# Patient Record
Sex: Male | Born: 1985 | Race: White | Hispanic: No | Marital: Married | Smoking: Former smoker
Health system: Southern US, Community
[De-identification: ages and names within clinical notes are randomized; demographics above are authoritative.]

## PROBLEM LIST (undated history)

## (undated) DIAGNOSIS — K219 Gastro-esophageal reflux disease without esophagitis: Secondary | ICD-10-CM

## (undated) DIAGNOSIS — F411 Generalized anxiety disorder: Secondary | ICD-10-CM

## (undated) DIAGNOSIS — J309 Allergic rhinitis, unspecified: Secondary | ICD-10-CM

## (undated) DIAGNOSIS — I8290 Acute embolism and thrombosis of unspecified vein: Secondary | ICD-10-CM

## (undated) DIAGNOSIS — F329 Major depressive disorder, single episode, unspecified: Secondary | ICD-10-CM

## (undated) DIAGNOSIS — G43009 Migraine without aura, not intractable, without status migrainosus: Secondary | ICD-10-CM

## (undated) DIAGNOSIS — M519 Unspecified thoracic, thoracolumbar and lumbosacral intervertebral disc disorder: Secondary | ICD-10-CM

## (undated) HISTORY — DX: Gastro-esophageal reflux disease without esophagitis: K21.9

## (undated) HISTORY — DX: Unspecified thoracic, thoracolumbar and lumbosacral intervertebral disc disorder: M51.9

## (undated) HISTORY — DX: Allergic rhinitis, unspecified: J30.9

## (undated) HISTORY — DX: Generalized anxiety disorder: F41.1

## (undated) HISTORY — PX: SHOULDER SURGERY: SHX246

## (undated) HISTORY — PX: BACK SURGERY: SHX140

## (undated) HISTORY — DX: Major depressive disorder, single episode, unspecified: F32.9

## (undated) HISTORY — PX: OTHER SURGICAL HISTORY: SHX169

## (undated) HISTORY — DX: Migraine without aura, not intractable, without status migrainosus: G43.009

## (undated) HISTORY — PX: HAND SURGERY: SHX662

---
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
## 2007-09-30 ENCOUNTER — Ambulatory Visit (HOSPITAL_BASED_OUTPATIENT_CLINIC_OR_DEPARTMENT_OTHER): Admission: RE | Admit: 2007-09-30 | Discharge: 2007-09-30 | Payer: Self-pay | Admitting: Orthopedic Surgery

## 2007-10-14 ENCOUNTER — Ambulatory Visit: Payer: Self-pay | Admitting: Internal Medicine

## 2007-10-14 DIAGNOSIS — Z87898 Personal history of other specified conditions: Secondary | ICD-10-CM

## 2007-10-14 DIAGNOSIS — F411 Generalized anxiety disorder: Secondary | ICD-10-CM

## 2007-10-14 DIAGNOSIS — R51 Headache: Secondary | ICD-10-CM

## 2007-10-14 DIAGNOSIS — K219 Gastro-esophageal reflux disease without esophagitis: Secondary | ICD-10-CM | POA: Insufficient documentation

## 2007-10-14 DIAGNOSIS — R519 Headache, unspecified: Secondary | ICD-10-CM | POA: Insufficient documentation

## 2007-10-14 DIAGNOSIS — G43009 Migraine without aura, not intractable, without status migrainosus: Secondary | ICD-10-CM

## 2007-10-14 DIAGNOSIS — F329 Major depressive disorder, single episode, unspecified: Secondary | ICD-10-CM

## 2007-10-14 HISTORY — DX: Major depressive disorder, single episode, unspecified: F32.9

## 2007-10-14 HISTORY — DX: Generalized anxiety disorder: F41.1

## 2007-10-14 HISTORY — DX: Migraine without aura, not intractable, without status migrainosus: G43.009

## 2007-10-14 HISTORY — DX: Gastro-esophageal reflux disease without esophagitis: K21.9

## 2007-10-14 LAB — CONVERTED CEMR LAB
ALT: 22 units/L (ref 0–53)
AST: 17 units/L (ref 0–37)
Albumin: 4.3 g/dL (ref 3.5–5.2)
Alkaline Phosphatase: 56 units/L (ref 39–117)
BUN: 14 mg/dL (ref 6–23)
Basophils Absolute: 0 10*3/uL (ref 0.0–0.1)
Basophils Relative: 0.1 % (ref 0.0–1.0)
Bilirubin Urine: NEGATIVE
Bilirubin, Direct: 0.2 mg/dL (ref 0.0–0.3)
CO2: 32 meq/L (ref 19–32)
Calcium: 9.7 mg/dL (ref 8.4–10.5)
Chloride: 105 meq/L (ref 96–112)
Cholesterol: 148 mg/dL (ref 0–200)
Creatinine, Ser: 1.1 mg/dL (ref 0.4–1.5)
Eosinophils Absolute: 0.1 10*3/uL (ref 0.0–0.6)
Eosinophils Relative: 1.3 % (ref 0.0–5.0)
GFR calc Af Amer: 107 mL/min
GFR calc non Af Amer: 88 mL/min
Glucose, Bld: 83 mg/dL (ref 70–99)
HCT: 43.6 % (ref 39.0–52.0)
HDL: 53.8 mg/dL (ref >39.0)
Hemoglobin, Urine: NEGATIVE
Hemoglobin: 15.2 g/dL (ref 13.0–17.0)
Ketones, ur: NEGATIVE mg/dL
LDL Cholesterol: 80 mg/dL (ref 0–99)
Leukocytes, UA: NEGATIVE
Lymphocytes Relative: 18 % (ref 12.0–46.0)
MCHC: 34.7 g/dL (ref 30.0–36.0)
MCV: 88.6 fL (ref 78.0–100.0)
Monocytes Absolute: 0.5 10*3/uL (ref 0.2–0.7)
Monocytes Relative: 8.3 % (ref 3.0–11.0)
Neutro Abs: 4.3 10*3/uL (ref 1.4–7.7)
Neutrophils Relative %: 72.3 % (ref 43.0–77.0)
Nitrite: NEGATIVE
Platelets: 190 10*3/uL (ref 150–400)
Potassium: 4 meq/L (ref 3.5–5.1)
RBC: 4.93 M/uL (ref 4.22–5.81)
RDW: 11.7 % (ref 11.5–14.6)
Sodium: 142 meq/L (ref 135–145)
Specific Gravity, Urine: 1.025 (ref 1.000–1.03)
TSH: 1.97 microintl units/mL (ref 0.35–5.50)
Total Bilirubin: 1.2 mg/dL (ref 0.3–1.2)
Total CHOL/HDL Ratio: 2.8
Total Protein, Urine: NEGATIVE mg/dL
Total Protein: 7.2 g/dL (ref 6.0–8.3)
Triglycerides: 73 mg/dL (ref 0–149)
Urine Glucose: NEGATIVE mg/dL
Urobilinogen, UA: 0.2 (ref 0.0–1.0)
VLDL: 15 mg/dL (ref 0–40)
WBC: 6 10*3/uL (ref 4.5–10.5)
pH: 6 (ref 5.0–8.0)

## 2007-12-22 ENCOUNTER — Encounter: Payer: Self-pay | Admitting: Internal Medicine

## 2008-10-25 ENCOUNTER — Ambulatory Visit: Payer: Self-pay | Admitting: Internal Medicine

## 2008-10-25 DIAGNOSIS — J019 Acute sinusitis, unspecified: Secondary | ICD-10-CM

## 2008-10-26 LAB — CONVERTED CEMR LAB
ALT: 19 units/L (ref 0–53)
AST: 20 units/L (ref 0–37)
Albumin: 4 g/dL (ref 3.5–5.2)
Alkaline Phosphatase: 52 units/L (ref 39–117)
BUN: 12 mg/dL (ref 6–23)
Basophils Absolute: 0 10*3/uL (ref 0.0–0.1)
Basophils Relative: 0 % (ref 0.0–3.0)
Bilirubin Urine: NEGATIVE
Bilirubin, Direct: 0.1 mg/dL (ref 0.0–0.3)
CO2: 31 meq/L (ref 19–32)
Calcium: 9.3 mg/dL (ref 8.4–10.5)
Chloride: 106 meq/L (ref 96–112)
Cholesterol: 125 mg/dL (ref 0–200)
Creatinine, Ser: 0.9 mg/dL (ref 0.4–1.5)
Eosinophils Absolute: 0.2 10*3/uL (ref 0.0–0.7)
Eosinophils Relative: 1.9 % (ref 0.0–5.0)
GFR calc Af Amer: 133 mL/min
GFR calc non Af Amer: 110 mL/min
Glucose, Bld: 97 mg/dL (ref 70–99)
HCT: 40.9 % (ref 39.0–52.0)
HDL: 38.5 mg/dL — ABNORMAL LOW (ref >39.0)
Hemoglobin: 14.3 g/dL (ref 13.0–17.0)
Ketones, ur: NEGATIVE mg/dL
LDL Cholesterol: 65 mg/dL (ref 0–99)
Leukocytes, UA: NEGATIVE
Lymphocytes Relative: 18.6 % (ref 12.0–46.0)
MCHC: 35 g/dL (ref 30.0–36.0)
MCV: 89.8 fL (ref 78.0–100.0)
Monocytes Absolute: 0.6 10*3/uL (ref 0.1–1.0)
Monocytes Relative: 7.2 % (ref 3.0–12.0)
Neutro Abs: 5.7 10*3/uL (ref 1.4–7.7)
Neutrophils Relative %: 72.3 % (ref 43.0–77.0)
Nitrite: NEGATIVE
Platelets: 200 10*3/uL (ref 150–400)
Potassium: 4 meq/L (ref 3.5–5.1)
RBC: 4.56 M/uL (ref 4.22–5.81)
RDW: 11.6 % (ref 11.5–14.6)
Sodium: 141 meq/L (ref 135–145)
Specific Gravity, Urine: 1.02 (ref 1.000–1.03)
TSH: 2.27 microintl units/mL (ref 0.35–5.50)
Total Bilirubin: 0.9 mg/dL (ref 0.3–1.2)
Total CHOL/HDL Ratio: 3.2
Total Protein, Urine: NEGATIVE mg/dL
Total Protein: 7.1 g/dL (ref 6.0–8.3)
Triglycerides: 106 mg/dL (ref 0–149)
Urine Glucose: NEGATIVE mg/dL
Urobilinogen, UA: 2 (ref 0.0–1.0)
VLDL: 21 mg/dL (ref 0–40)
WBC: 8 10*3/uL (ref 4.5–10.5)
pH: 7 (ref 5.0–8.0)

## 2009-12-11 ENCOUNTER — Ambulatory Visit: Payer: Self-pay | Admitting: Internal Medicine

## 2009-12-11 LAB — CONVERTED CEMR LAB
ALT: 21 units/L (ref 0–53)
AST: 21 units/L (ref 0–37)
Albumin: 4.3 g/dL (ref 3.5–5.2)
Alkaline Phosphatase: 57 units/L (ref 39–117)
BUN: 11 mg/dL (ref 6–23)
Basophils Absolute: 0 10*3/uL (ref 0.0–0.1)
Basophils Relative: 0.8 % (ref 0.0–3.0)
Bilirubin Urine: NEGATIVE
Bilirubin, Direct: 0.2 mg/dL (ref 0.0–0.3)
CO2: 31 meq/L (ref 19–32)
Calcium: 9.2 mg/dL (ref 8.4–10.5)
Chloride: 108 meq/L (ref 96–112)
Cholesterol: 125 mg/dL (ref 0–200)
Creatinine, Ser: 1 mg/dL (ref 0.4–1.5)
Eosinophils Absolute: 0.2 10*3/uL (ref 0.0–0.7)
Eosinophils Relative: 3.6 % (ref 0.0–5.0)
GFR calc non Af Amer: 96.39 mL/min (ref >60)
Glucose, Bld: 98 mg/dL (ref 70–99)
HCT: 41.4 % (ref 39.0–52.0)
HDL: 62.6 mg/dL (ref >39.00)
Hemoglobin, Urine: NEGATIVE
Hemoglobin: 13.8 g/dL (ref 13.0–17.0)
Ketones, ur: NEGATIVE mg/dL
LDL Cholesterol: 44 mg/dL (ref 0–99)
Leukocytes, UA: NEGATIVE
Lymphocytes Relative: 22.2 % (ref 12.0–46.0)
Lymphs Abs: 1.4 10*3/uL (ref 0.7–4.0)
MCHC: 33.5 g/dL (ref 30.0–36.0)
MCV: 91.4 fL (ref 78.0–100.0)
Monocytes Absolute: 0.6 10*3/uL (ref 0.1–1.0)
Monocytes Relative: 9.7 % (ref 3.0–12.0)
Neutro Abs: 4 10*3/uL (ref 1.4–7.7)
Neutrophils Relative %: 63.7 % (ref 43.0–77.0)
Nitrite: NEGATIVE
Platelets: 197 10*3/uL (ref 150.0–400.0)
Potassium: 4.3 meq/L (ref 3.5–5.1)
RBC: 4.52 M/uL (ref 4.22–5.81)
RDW: 11.9 % (ref 11.5–14.6)
Sodium: 143 meq/L (ref 135–145)
Specific Gravity, Urine: >=1.03 (ref 1.000–1.030)
TSH: 2.46 microintl units/mL (ref 0.35–5.50)
Total Bilirubin: 0.7 mg/dL (ref 0.3–1.2)
Total CHOL/HDL Ratio: 2
Total Protein, Urine: NEGATIVE mg/dL
Total Protein: 7.5 g/dL (ref 6.0–8.3)
Triglycerides: 90 mg/dL (ref 0.0–149.0)
Urine Glucose: NEGATIVE mg/dL
Urobilinogen, UA: 1 (ref 0.0–1.0)
VLDL: 18 mg/dL (ref 0.0–40.0)
WBC: 6.2 10*3/uL (ref 4.5–10.5)
pH: 6.5 (ref 5.0–8.0)

## 2009-12-13 ENCOUNTER — Ambulatory Visit: Payer: Self-pay | Admitting: Internal Medicine

## 2010-11-06 NOTE — Assessment & Plan Note (Signed)
Summary: CPX/ NWS #   Vital Signs:  Patient profile:   24 year old male Height:      70 inches Weight:      170.50 pounds BMI:     24.55 O2 Sat:      98 % on Room air Temp:     97.7 degrees F oral Pulse rate:   58 / minute BP sitting:   112 / 70  (left arm) Cuff size:   regular  Vitals Entered ByZella Ball Ewing (December 13, 2009 1:26 PM)  O2 Flow:  Room air  Preventive Care Screening     had flu shot last fall 2010 - free at work  CC: Adult physical/RE   CC:  Adult physical/RE.  History of Present Illness: overall doing well, no specific complaints, except for ongoing stress, mild worsening depressive symptoms without suicidal ideation or panic, and ongoing recurrent migrainous headaches that respond to imitrex but happen multiple times per wk.  Pt denies CP, sob, doe, wheezing, orthopnea, pnd, worsening LE edema, palps, dizziness or syncope  Pt denies new neuro symptoms such as other headache, facial or extremity weakness   Problems Prior to Update: 1)  Sinusitis- Acute-nos  (ICD-461.9) 2)  Preventive Health Care  (ICD-V70.0) 3)  Gerd  (ICD-530.81) 4)  Preventive Health Care  (ICD-V70.0) 5)  Anxiety  (ICD-300.00) 6)  Headache  (ICD-784.0) 7)  Depression  (ICD-311) 8)  Common Migraine  (ICD-346.10) 9)  Migraines, Hx of  (ICD-V13.8)  Medications Prior to Update: 1)  Imitrex 100 Mg  Tabs (Sumatriptan Succinate) .Marland Kitchen.. 1 By Mouth Once Daily As Needed 2)  Fluoxetine Hcl 20 Mg Caps (Fluoxetine Hcl) .Marland Kitchen.. 1po Once Daily 3)  Omeprazole 20 Mg  Cpdr (Omeprazole) .Marland Kitchen.. 1 By Mouth Once Daily 4)  Cephalexin 500 Mg Caps (Cephalexin) .Marland Kitchen.. 1 By Mouth Three Times A Day  Current Medications (verified): 1)  Sumatriptan Succinate 100 Mg Tabs (Sumatriptan Succinate) .Marland Kitchen.. 1 By Mouth Every Other Day As Needed 2)  Fluoxetine Hcl 40 Mg Caps (Fluoxetine Hcl) .Marland Kitchen.. 1 By Mouth Once Daily 3)  Omeprazole 20 Mg  Cpdr (Omeprazole) .Marland Kitchen.. 1 By Mouth Once Daily  Allergies (verified): 1)  ! Ceclor  Past  History:  Past Medical History: Last updated: 10/14/2007 migraine Depression Anxiety  Past Surgical History: Last updated: 10/14/2007 right hand and right foot after accident at work  Family History: Last updated: 10/14/2007 father with depression grandfather with depression grandmother with MI grandfather with brain tumor mother with HTN mother and grandmother with migraine  Social History: Last updated: 10/25/2008 Never Smoked Alcohol use-yes Married 1 child work - loading trucks for food [employer]  Risk Factors: Smoking Status: never (10/14/2007)  Review of Systems  The patient denies anorexia, fever, weight loss, weight gain, vision loss, decreased hearing, hoarseness, chest pain, syncope, dyspnea on exertion, peripheral edema, prolonged cough, hemoptysis, abdominal pain, melena, hematochezia, severe indigestion/heartburn, hematuria, muscle weakness, suspicious skin lesions, transient blindness, difficulty walking, depression, unusual weight change, abnormal bleeding, enlarged lymph nodes, and angioedema.         all otherwise negative per pt -   Physical Exam  General:  alert and well-developed.   Head:  normocephalic and atraumatic.   Eyes:  vision grossly intact, pupils equal, and pupils round.   Ears:  R ear normal and L ear normal.   Nose:  no external deformity and no nasal discharge.   Mouth:  no gingival abnormalities and pharynx pink and moist.   Neck:  supple and no masses.   Lungs:  normal respiratory effort and normal breath sounds.   Heart:  normal rate and regular rhythm.   Abdomen:  soft, non-tender, and normal bowel sounds.   Msk:  no joint tenderness and no joint swelling.   Extremities:  no edema, no erythema  Neurologic:  cranial nerves II-XII intact and strength normal in all extremities.     Impression & Recommendations:  Problem # 1:  Preventive Health Care (ICD-V70.0) Overall doing well, age appropriate education and counseling  updated and referral for appropriate preventive services done unless declined, immunizations up to date or declined, diet counseling done if overweight, urged to quit smoking if smokes , most recent labs reviewed and current ordered if appropriate, ecg reviewed or declined (interpretation per ECG scanned in the EMR if done); information regarding Medicare Prevention requirements given if appropriate   Problem # 2:  COMMON MIGRAINE (ICD-346.10)  His updated medication list for this problem includes:    Sumatriptan Succinate 100 Mg Tabs (Sumatriptan succinate) .Marland Kitchen... 1 by mouth every other day as needed wiith persistent stable but relatively high freq headaches - refer [facility]  Orders: Headache Clinic Referral (Headache)  Problem # 3:  DEPRESSION (ICD-311)  His updated medication list for this problem includes:    Fluoxetine Hcl 40 Mg Caps (Fluoxetine hcl) .Marland Kitchen... 1 by mouth once daily med increased as above   Complete Medication List: 1)  Sumatriptan Succinate 100 Mg Tabs (Sumatriptan succinate) .Marland Kitchen.. 1 by mouth every other day as needed 2)  Fluoxetine Hcl 40 Mg Caps (Fluoxetine hcl) .Marland Kitchen.. 1 by mouth once daily 3)  Omeprazole 20 Mg Cpdr (Omeprazole) .Marland Kitchen.. 1 by mouth once daily  Patient Instructions: 1)  increase the fluoxetine to 40 mg per day 2)  Continue all previous medications as before this visit 3)  You will be contacted about the referral(s) to: [facility] 4)  Please schedule a follow-up appointment in 1 year or sooner if needed Prescriptions: OMEPRAZOLE 20 MG  CPDR (OMEPRAZOLE) 1 by mouth once daily  #90 x 3   Entered and Authorized by:   Corwin Levins MD   Signed by:   Corwin Levins MD on 12/13/2009   Method used:   Electronically to        [facility] #[id number]* (retail)       [address] [place], [place]  [address]       Ph: [phone]       Fax: [phone]   RxID:   [id number] SUMATRIPTAN SUCCINATE 100 MG TABS (SUMATRIPTAN  SUCCINATE) 1 by mouth every other day as needed  #27 x 3   Entered and Authorized by:   Corwin Levins MD   Signed by:   Corwin Levins MD on 12/13/2009   Method used:   Electronically to        [id number]* (retail)       [address] [place], [place]  [address]       Ph: [phone]       Fax: [phone]   RxID:   [id number] FLUOXETINE HCL 40 MG CAPS (FLUOXETINE HCL) 1 by mouth once daily  #90 x 3   Entered and Authorized by:   Corwin Levins MD   Signed by:   Corwin Levins MD on 12/13/2009   Method used:   Electronically to  [id number]* (retail)       [address] [place], [place]  [address]       Ph: [phone]       Fax: [phone]   RxID:   [id number]

## 2011-02-19 NOTE — Op Note (Signed)
NAME:  Ernest Mathews, Ernest Mathews NO.:  [id number]   MEDICAL RECORD NO.:  [id number]          PATIENT TYPE:  AMB   LOCATION:  [facility]                          FACILITY:  [facility]   PHYSICIAN:  Cindee Salt, M.D.       DATE OF BIRTH:  [birth year]   DATE OF PROCEDURE:  09/30/2007  DATE OF DISCHARGE:                               OPERATIVE REPORT   ANESTHESIOLOGIST:  Zenon Mayo, MD   PREOPERATIVE DIAGNOSIS:  Carpal tunnel syndrome, right hand.   POSTOPERATIVE DIAGNOSIS:  Carpal tunnel syndrome, right hand.   OPERATION:  Decompression of right median nerve.   ANESTHESIA:  General.   HISTORY:  The patient is a 25 year old male who suffered a crush injury  to his right hand.  He has had numbness and tingling since the injury.  This has not responded to elevation, conservative treatment, and Medrol  Dosepak.  He is admitted now for decompression of the median nerve,  right hand.  The pre-, peri-, and postoperative course were discussed  along with the risks and complications.  He is aware that there is no  guarantee with the surgery and of the possibility of infection,  recurrence, injury to arteries, nerves, tendons, complete relief of  symptoms, and dystrophy.  In the preoperative area the patient is seen.  Questions again are encouraged and answered.  The extremity is marked by  both the patient and the surgeon.  The antibiotic is given.   PROCEDURE IN DETAIL:  The patient is brought to the operating room where  a general anesthetic was carried out using LMA by the anesthesiologist,  Dr. Sampson Goon.  He was prepped using DuraPrep in the supine position  with the right arm free.  A longitudinal incision was made in the palm  and carried down through subcutaneous tissue.  Bleeders were  electrocauterized.  The palmar fascia was split.  The superficial palmar  arch was identified.  The flexor tendon of the ring and little finger  were identified to the ulnar side of median  nerve.  The carpal  retinaculum was incised with sharp dissection.   A right-angle and Sewall retractor were placed between the skin and  forearm fascia.  Significant hematoma was noted proximally.  The wound  was then extended to the ulnar side of the wrist crease and then onto  the distal forearm and carried down through the subcutaneous tissue.  Bleeders again were electrocauterized.  The median nerve was identified.  Significant hematoma was present along the flexor sheath of the tendons.  More proximally the tendons appeared intact with full flexion/extension  of the fingers.  An exploration of the nerve was performed.   An area of compression was immediately apparent with a significant  hyperemia of the nerve over approximately a 3-cm distance.  The canal  was explored.  No further lesions were identified.  The AP, lateral, and  oblique x-rays of his wrist revealed no acute fractures of the distal  radius scaphoid, carpal bones, or metacarpals.  The wound was  irrigated.  The skin was then closed with interrupted  5-0 Vicryl Rapide  sutures.  Sterile compressive dressing and splint were applied.  The  patient tolerated the procedure well and was taken to the recovery room  for observation in satisfactory condition.  He will be discharged home  to return to his physician in [place] in one week on Vicodin.           ______________________________  Cindee Salt, M.D.     GK/MEDQ  D:  09/30/2007  T:  09/30/2007  Job:  [id number]

## 2011-07-12 LAB — POCT HEMOGLOBIN-HEMACUE
Hemoglobin: 10.9 — ABNORMAL LOW
Operator id: 12362

## 2012-07-21 ENCOUNTER — Encounter: Payer: Self-pay | Admitting: Internal Medicine

## 2012-07-21 ENCOUNTER — Other Ambulatory Visit (INDEPENDENT_AMBULATORY_CARE_PROVIDER_SITE_OTHER): Payer: BC Managed Care – PPO

## 2012-07-21 ENCOUNTER — Ambulatory Visit (INDEPENDENT_AMBULATORY_CARE_PROVIDER_SITE_OTHER): Payer: BC Managed Care – PPO | Admitting: Internal Medicine

## 2012-07-21 VITALS — BP 120/78 | HR 64 | Temp 98.7°F | Ht 70.0 in | Wt 167.5 lb

## 2012-07-21 DIAGNOSIS — Z0000 Encounter for general adult medical examination without abnormal findings: Secondary | ICD-10-CM

## 2012-07-21 DIAGNOSIS — F329 Major depressive disorder, single episode, unspecified: Secondary | ICD-10-CM

## 2012-07-21 DIAGNOSIS — K219 Gastro-esophageal reflux disease without esophagitis: Secondary | ICD-10-CM

## 2012-07-21 DIAGNOSIS — G43009 Migraine without aura, not intractable, without status migrainosus: Secondary | ICD-10-CM

## 2012-07-21 DIAGNOSIS — N529 Male erectile dysfunction, unspecified: Secondary | ICD-10-CM

## 2012-07-21 LAB — CBC WITH DIFFERENTIAL/PLATELET
Eosinophils Relative: 0.4 % (ref 0.0–5.0)
HCT: 43.1 % (ref 39.0–52.0)
Hemoglobin: 14.3 g/dL (ref 13.0–17.0)
Lymphs Abs: 1 10*3/uL (ref 0.7–4.0)
Monocytes Relative: 8.6 % (ref 3.0–12.0)
Neutro Abs: 5.4 10*3/uL (ref 1.4–7.7)
Platelets: 216 10*3/uL (ref 150.0–400.0)
WBC: 7 10*3/uL (ref 4.5–10.5)

## 2012-07-21 LAB — URINALYSIS, ROUTINE W REFLEX MICROSCOPIC
Bilirubin Urine: NEGATIVE
Ketones, ur: NEGATIVE
Leukocytes, UA: NEGATIVE
Specific Gravity, Urine: 1.015 (ref 1.000–1.030)
Total Protein, Urine: NEGATIVE
Urine Glucose: NEGATIVE
pH: 7.5 (ref 5.0–8.0)

## 2012-07-21 LAB — BASIC METABOLIC PANEL
BUN: 13 mg/dL (ref 6–23)
Calcium: 9 mg/dL (ref 8.4–10.5)
Creatinine, Ser: 1 mg/dL (ref 0.4–1.5)
GFR: 94.5 mL/min (ref >60.00)
Glucose, Bld: 90 mg/dL (ref 70–99)

## 2012-07-21 LAB — LIPID PANEL
Cholesterol: 140 mg/dL (ref 0–200)
HDL: 49.1 mg/dL (ref >39.00)
VLDL: 14.6 mg/dL (ref 0.0–40.0)

## 2012-07-21 LAB — TSH: TSH: 0.97 u[IU]/mL (ref 0.35–5.50)

## 2012-07-21 LAB — HEPATIC FUNCTION PANEL: Albumin: 4.1 g/dL (ref 3.5–5.2)

## 2012-07-21 MED ORDER — TADALAFIL 20 MG PO TABS: 20.0000 mg | ORAL_TABLET | Freq: Every day | ORAL | Status: DC | PRN

## 2012-07-21 MED ORDER — SUMATRIPTAN SUCCINATE 100 MG PO TABS: 100.0000 mg | ORAL_TABLET | ORAL | Status: DC | PRN

## 2012-07-21 MED ORDER — OMEPRAZOLE 20 MG PO CPDR: DELAYED_RELEASE_CAPSULE | ORAL | Status: DC

## 2012-07-21 MED ORDER — FLUOXETINE HCL 40 MG PO CAPS: 40.0000 mg | ORAL_CAPSULE | Freq: Every day | ORAL | Status: DC

## 2012-07-21 MED ORDER — TOPIRAMATE 50 MG PO TABS: 50.0000 mg | ORAL_TABLET | Freq: Two times a day (BID) | ORAL | Status: DC

## 2012-07-21 NOTE — Assessment & Plan Note (Signed)
Ok to re-start PPI,  to f/u any worsening symptoms or concerns  

## 2012-07-21 NOTE — Patient Instructions (Addendum)
Take all new medications as prescribed Continue all other medications as before Remember, the Topamax is started slow and increased over several days, due to sleepiness that can be caused by taking too much to start.  Please start the Topamax at 1/2 tab by mouth for 3 nights, then 1/2 tab twice per day for 3 days, then 1/2 tab in the am and 1 tab in the pm for 3 days, then 1 whole tab twice per day after that  Please go to [email or web address] for the 3 free coupon Please go to LAB in the Basement for the blood and/or urine tests to be done today You will be contacted by phone if any changes need to be made immediately.  Otherwise, you will receive a letter about your results with an explanation, but check MyChart first. Thank you for enrolling in MyChart. Please follow the instructions below to securely access your online medical record. MyChart allows you to send messages to your doctor, view your test results, renew your prescriptions, schedule appointments, and more. Please return in 1 year for your yearly visit, or sooner if needed, with Lab testing done 3-5 days before

## 2012-07-21 NOTE — Assessment & Plan Note (Signed)
Verified nonsuicidal, for prozac 40 qd re-start

## 2012-07-21 NOTE — Assessment & Plan Note (Signed)

## 2012-07-21 NOTE — Assessment & Plan Note (Signed)
Young for symtpoms, for testosterone check, tx with cialis prn,  to f/u any worsening symptoms or concerns

## 2012-07-21 NOTE — Progress Notes (Signed)
Subjective:    Patient ID: Ernest Mathews, male    DOB: 1986-04-27, 26 y.o.   MRN: [id number]  HPI  Here for wellness and f/u;  Overall doing ok;  Pt denies CP, worsening SOB, DOE, wheezing, orthopnea, PND, worsening LE edema, palpitations, dizziness or syncope.  Pt denies neurological change such as new Headache, facial or extremity weakness.  Pt denies polydipsia, polyuria, or low sugar symptoms. Pt states overall good compliance with treatment and medications, good tolerability, and trying to follow lower cholesterol diet.  Pt has had mild worsening depressive symptoms, but no suicidal ideation or panic. No fever, wt loss, night sweats, loss of appetite, or other constitutional symptoms.  Pt states good ability with ADL's, low fall risk, home safety reviewed and adequate, no significant changes in hearing or vision, and trying to be more active with exercise.  Would like to re-start prozac 40 since he did well with this in the past;  Has had increased ED symptoms with loss of erection during intercourse several times in past few months.  Needs imitrex refill, also would like to try preventive med such as topamax as HA's are 2-4 per wk.  Has had mild worsening reflux,but no dysphagia, abd pain, n/v, bowel change or blood. Past Medical History  Diagnosis Date  . ANXIETY 10/14/2007    Qualifier: Diagnosis of  By: Jonny Ruiz MD, Len Blalock   . COMMON MIGRAINE 10/14/2007    Qualifier: Diagnosis of  By: Jonny Ruiz MD, Len Blalock   . DEPRESSION 10/14/2007    Qualifier: Diagnosis of  By: Jonny Ruiz MD, Len Blalock   . GERD 10/14/2007    Qualifier: Diagnosis of  By: Jonny Ruiz MD, Len Blalock    Past Surgical History  Procedure Date  . Hand surgery   . Foot surgury     reports that he has never smoked. He has never used smokeless tobacco. He reports that he drinks alcohol. He reports that he does not use illicit drugs. family history includes Depression in his father; Hypertension in his mother; and Migraines in his mother. Allergies  Allergen  Reactions  . Cefaclor    Current Outpatient Prescriptions on File Prior to Visit  Medication Sig Dispense Refill  . FLUoxetine (PROZAC) 40 MG capsule Take 1 capsule (40 mg total) by mouth daily.  90 capsule  3  . omeprazole (PRILOSEC) 20 MG capsule 1-2 tabs by mouth per day  180 capsule  3  . SUMAtriptan (IMITREX) 100 MG tablet Take 1 tablet (100 mg total) by mouth every 2 (two) hours as needed for migraine.  10 tablet  11  . tadalafil (CIALIS) 20 MG tablet Take 1 tablet (20 mg total) by mouth daily as needed for erectile dysfunction.  10 tablet  11  . topiramate (TOPAMAX) 50 MG tablet Take 1 tablet (50 mg total) by mouth 2 (two) times daily.  180 tablet  3   Review of Systems Review of Systems  Constitutional: Negative for diaphoresis, activity change, appetite change and unexpected weight change.  HENT: Negative for hearing loss, ear pain, facial swelling, mouth sores and neck stiffness.   Eyes: Negative for pain, redness and visual disturbance.  Respiratory: Negative for shortness of breath and wheezing.   Cardiovascular: Negative for chest pain and palpitations.  Gastrointestinal: Negative for diarrhea, blood in stool, abdominal distention and rectal pain.  Genitourinary: Negative for hematuria, flank pain and decreased urine volume.  Musculoskeletal: Negative for myalgias and joint swelling.  Skin: Negative for color change and wound.  Neurological: Negative for syncope and numbness.  Hematological: Negative for adenopathy.  Psychiatric/Behavioral: Negative for hallucinations, self-injury, decreased concentration and agitation.     Objective:   Physical Exam BP 120/78  Pulse 64  Temp 98.7 F (37.1 C) (Oral)  Ht 5\' 10"  (1.778 m)  Wt 167 lb 8 oz (75.978 kg)  BMI 24.03 kg/m2  SpO2 98% Physical Exam  VS noted Constitutional: Pt is oriented to person, place, and time. Appears well-developed and well-nourished.  Head: Normocephalic and atraumatic.  Right Ear: External ear  normal.  Left Ear: External ear normal.  Nose: Nose normal.  Mouth/Throat: Oropharynx is clear and moist.  Eyes: Conjunctivae and EOM are normal. Pupils are equal, round, and reactive to light.  Neck: Normal range of motion. Neck supple. No JVD present. No tracheal deviation present.  Cardiovascular: Normal rate, regular rhythm, normal heart sounds and intact distal pulses.   Pulmonary/Chest: Effort normal and breath sounds normal.  Abdominal: Soft. Bowel sounds are normal. There is no tenderness.  Musculoskeletal: Normal range of motion. Exhibits no edema.  Lymphadenopathy:  Has no cervical adenopathy.  Neurological: Pt is alert and oriented to person, place, and time. Pt has normal reflexes. No cranial nerve deficit. Motor/dtr/gait intact Skin: Skin is warm and dry. No rash noted.  Psychiatric:  1-2+ nerfvous, not depressed affect,. Behavior is normal.     Assessment & Plan:

## 2012-07-21 NOTE — Assessment & Plan Note (Signed)
Ok for imitrex refill, to start topamax asd- 50 bid

## 2012-07-22 LAB — LIPID PANEL
Cholesterol: 139 mg/dL (ref 0–200)
LDL Cholesterol: 76 mg/dL (ref 0–99)
Triglycerides: 74 mg/dL (ref 0.0–149.0)

## 2012-07-22 LAB — TESTOSTERONE, FREE, TOTAL, SHBG
Sex Hormone Binding: 34 nmol/L (ref 13–71)
Testosterone, Free: 70 pg/mL (ref 47.0–244.0)
Testosterone: 355.69 ng/dL (ref 300–890)

## 2012-11-21 ENCOUNTER — Other Ambulatory Visit: Payer: Self-pay

## 2013-03-10 ENCOUNTER — Ambulatory Visit (INDEPENDENT_AMBULATORY_CARE_PROVIDER_SITE_OTHER): Payer: BC Managed Care – PPO | Admitting: Internal Medicine

## 2013-03-10 ENCOUNTER — Encounter: Payer: Self-pay | Admitting: Internal Medicine

## 2013-03-10 VITALS — BP 112/72 | HR 54 | Temp 98.7°F | Ht 70.0 in | Wt 173.5 lb

## 2013-03-10 DIAGNOSIS — F329 Major depressive disorder, single episode, unspecified: Secondary | ICD-10-CM

## 2013-03-10 DIAGNOSIS — Z302 Encounter for sterilization: Secondary | ICD-10-CM

## 2013-03-10 DIAGNOSIS — F411 Generalized anxiety disorder: Secondary | ICD-10-CM

## 2013-03-10 DIAGNOSIS — Z0000 Encounter for general adult medical examination without abnormal findings: Secondary | ICD-10-CM

## 2013-03-10 DIAGNOSIS — J309 Allergic rhinitis, unspecified: Secondary | ICD-10-CM

## 2013-03-10 HISTORY — DX: Allergic rhinitis, unspecified: J30.9

## 2013-03-10 MED ORDER — FLUTICASONE PROPIONATE 50 MCG/ACT NA SUSP: 2.0000 | Freq: Every day | NASAL | Status: DC

## 2013-03-10 MED ORDER — METHYLPREDNISOLONE ACETATE 80 MG/ML IJ SUSP
80.0000 mg | Freq: Once | INTRAMUSCULAR | Status: AC
  Administered 2013-03-10: 80 mg via INTRAMUSCULAR

## 2013-03-10 MED ORDER — CLONAZEPAM 0.5 MG PO TABS: 0.5000 mg | ORAL_TABLET | Freq: Two times a day (BID) | ORAL | Status: DC | PRN

## 2013-03-10 MED ORDER — FEXOFENADINE HCL 180 MG PO TABS: 180.0000 mg | ORAL_TABLET | Freq: Every day | ORAL | Status: DC

## 2013-03-10 NOTE — Assessment & Plan Note (Signed)
stable overall by history and exam, and pt to continue medical treatment as before,  to f/u any worsening symptoms or concerns 

## 2013-03-10 NOTE — Patient Instructions (Addendum)
You had the steroid shot today Please take all new medication as prescribed - the allegra and flonase, as well as the klonopin for nerves You will be contacted regarding the referral for: urology Please return in 6 months, or sooner if needed, with Lab testing done 3-5 days before

## 2013-03-10 NOTE — Assessment & Plan Note (Signed)
Ok for urology referral,

## 2013-03-10 NOTE — Progress Notes (Signed)
Subjective:    Patient ID: Ernest Mathews, male    DOB: August 07, 1986, 27 y.o.   MRN: [id number]  HPI  Here to f/u. Rober Minion have several wks ongoing nasal allergy symptoms with clearish congestion, itch and sneezing, without fever, pain, ST, cough, swelling or wheezing.  Denies worsening depressive symptoms, suicidal ideation, or panic; has ongoing anxiety, some increased last month due to mult work and family stressors.  Has 2 children, does not want further, states wife in agreement, asks for urology referral for vasectomy.   Past Medical History  Diagnosis Date  . ANXIETY 10/14/2007    Qualifier: Diagnosis of  By: Jonny Ruiz MD, Len Blalock   . COMMON MIGRAINE 10/14/2007    Qualifier: Diagnosis of  By: Jonny Ruiz MD, Len Blalock   . DEPRESSION 10/14/2007    Qualifier: Diagnosis of  By: Jonny Ruiz MD, Len Blalock   . GERD 10/14/2007    Qualifier: Diagnosis of  By: Jonny Ruiz MD, Len Blalock   . Allergic rhinitis, cause unspecified 03/10/2013   Past Surgical History  Procedure Laterality Date  . Hand surgery    . Foot surgury      reports that he has never smoked. He has never used smokeless tobacco. He reports that  drinks alcohol. He reports that he does not use illicit drugs. family history includes Depression in his father; Hypertension in his mother; and Migraines in his mother. Allergies  Allergen Reactions  . Cefaclor    Current Outpatient Prescriptions on File Prior to Visit  Medication Sig Dispense Refill  . FLUoxetine (PROZAC) 40 MG capsule Take 1 capsule (40 mg total) by mouth daily.  90 capsule  3  . omeprazole (PRILOSEC) 20 MG capsule 1-2 tabs by mouth per day  180 capsule  3  . SUMAtriptan (IMITREX) 100 MG tablet Take 1 tablet (100 mg total) by mouth every 2 (two) hours as needed for migraine.  10 tablet  11  . tadalafil (CIALIS) 20 MG tablet Take 1 tablet (20 mg total) by mouth daily as needed for erectile dysfunction.  10 tablet  11  . topiramate (TOPAMAX) 50 MG tablet Take 1 tablet (50 mg total) by mouth 2 (two) times  daily.  180 tablet  3   No current facility-administered medications on file prior to visit.   Review of Systems  Constitutional: Negative for unexpected weight change, or unusual diaphoresis  HENT: Negative for tinnitus.   Eyes: Negative for photophobia and visual disturbance.  Respiratory: Negative for choking and stridor.   Gastrointestinal: Negative for vomiting and blood in stool.  Genitourinary: Negative for hematuria and decreased urine volume.  Musculoskeletal: Negative for acute joint swelling Skin: Negative for color change and wound.  Neurological: Negative for tremors and numbness other than noted  Psychiatric/Behavioral: Negative for decreased concentration or  hyperactivity.       Objective:   Physical Exam BP 112/72  Pulse 54  Temp(Src) 98.7 F (37.1 C) (Oral)  Ht 5\' 10"  (1.778 m)  Wt 173 lb 8 oz (78.699 kg)  BMI 24.89 kg/m2  SpO2 98% VS noted, non toxic Constitutional: Pt appears well-developed and well-nourished.  HENT: Head: NCAT.  Right Ear: External ear normal.  Left Ear: External ear normal.  Bilat tm's with mild erythema.  Max sinus areas non tender.  Pharynx with mild erythema, no exudate Eyes: Conjunctivae and EOM are normal. Pupils are equal, round, and reactive to light.  Neck: Normal range of motion. Neck supple.  Cardiovascular: Normal rate and regular rhythm.  Pulmonary/Chest: Effort normal and breath sounds normal.  Neurological: Pt is alert. Not confused , motor 5/5 Skin: Skin is warm. No erythema.  Psychiatric: Pt behavior is normal. Thought content normal. 1-2+ nervous, not depressed affect     Assessment & Plan:

## 2013-03-10 NOTE — Assessment & Plan Note (Signed)
Mild to mod, for depomedrol IM, allegra/flonase,  to f/u any worsening symptoms or concerns

## 2013-03-10 NOTE — Assessment & Plan Note (Signed)
stable overall by history and exam, recent data reviewed with pt, and pt to continue medical treatment as before,  to f/u any worsening symptoms or concerns Lab Results  Component Value Date   WBC 7.0 07/21/2012   HGB 14.3 07/21/2012   HCT 43.1 07/21/2012   PLT 216.0 07/21/2012   GLUCOSE 90 07/21/2012   CHOL 140 07/21/2012   CHOL 139 07/21/2012   TRIG 73.0 07/21/2012   TRIG 74.0 07/21/2012   HDL 49.10 07/21/2012   HDL 48.50 07/21/2012   LDLCALC 76 07/21/2012   LDLCALC 76 07/21/2012   ALT 27 07/21/2012   AST 21 07/21/2012   NA 139 07/21/2012   K 3.9 07/21/2012   CL 103 07/21/2012   CREATININE 1.0 07/21/2012   BUN 13 07/21/2012   CO2 29 07/21/2012   TSH 0.97 07/21/2012

## 2013-03-25 ENCOUNTER — Telehealth: Payer: Self-pay

## 2013-03-25 NOTE — Telephone Encounter (Signed)
Very sorry, but due to the controlled substance nature of the medication, this medication cannot be replaced or renewed prior next refill date, unless there is a police report

## 2013-03-25 NOTE — Telephone Encounter (Signed)
The patient lost his klonopin prescription and would like a new rx.  Advise please

## 2013-03-25 NOTE — Telephone Encounter (Signed)
Called the patient and number in chart no longer working.

## 2013-03-25 NOTE — Telephone Encounter (Signed)
Called the patient number in chart is not working.

## 2013-03-26 NOTE — Telephone Encounter (Signed)
Unable to contact the patient as phone number is chart does not work.  Will close the note and refer back to MD instructions if patient returns call.

## 2013-07-28 ENCOUNTER — Ambulatory Visit (INDEPENDENT_AMBULATORY_CARE_PROVIDER_SITE_OTHER): Payer: BC Managed Care – PPO | Admitting: Internal Medicine

## 2013-07-28 ENCOUNTER — Encounter: Payer: Self-pay | Admitting: Internal Medicine

## 2013-07-28 VITALS — BP 120/82 | HR 61 | Temp 97.9°F | Ht 71.0 in | Wt 169.0 lb

## 2013-07-28 DIAGNOSIS — M549 Dorsalgia, unspecified: Secondary | ICD-10-CM | POA: Insufficient documentation

## 2013-07-28 MED ORDER — CYCLOBENZAPRINE HCL 5 MG PO TABS
5.0000 mg | ORAL_TABLET | Freq: Three times a day (TID) | ORAL | Status: DC | PRN
Start: 2013-07-28 — End: 2013-09-04

## 2013-07-28 MED ORDER — TRAMADOL HCL 50 MG PO TABS: 50.0000 mg | ORAL_TABLET | Freq: Four times a day (QID) | ORAL | Status: DC | PRN

## 2013-07-28 MED ORDER — PREDNISONE 10 MG PO TABS: ORAL_TABLET | ORAL | Status: DC

## 2013-07-28 MED ORDER — FEXOFENADINE HCL 180 MG PO TABS: 180.0000 mg | ORAL_TABLET | Freq: Every day | ORAL | Status: DC

## 2013-07-28 NOTE — Progress Notes (Signed)
  Subjective:    Patient ID: Ernest Mathews, male    DOB: 1985/11/16, 27 y.o.   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 Pain now about 7/10.   Past Medical History  Diagnosis Date  . ANXIETY 10/14/2007    Qualifier: Diagnosis of  By: Jonny Ruiz MD, Len Blalock   . COMMON MIGRAINE 10/14/2007    Qualifier: Diagnosis of  By: Jonny Ruiz MD, Len Blalock   . DEPRESSION 10/14/2007    Qualifier: Diagnosis of  By: Jonny Ruiz MD, Len Blalock   . GERD 10/14/2007    Qualifier: Diagnosis of  By: Jonny Ruiz MD, Len Blalock   . Allergic rhinitis, cause unspecified 03/10/2013   Past Surgical History  Procedure Laterality Date  . Hand surgery    . Foot surgury      reports that he has never smoked. He has never used smokeless tobacco. He reports that he drinks alcohol. He reports that he does not use illicit drugs. family history includes Depression in his father; Hypertension in his mother; Migraines in his mother. Allergies  Allergen Reactions  . Cefaclor    Current Outpatient Prescriptions on File Prior to Visit  Medication Sig Dispense Refill  . clonazePAM (KLONOPIN) 0.5 MG tablet Take 1 tablet (0.5 mg total) by mouth 2 (two) times daily as needed for anxiety.  60 tablet  2  . FLUoxetine (PROZAC) 40 MG capsule Take 1 capsule (40 mg total) by mouth daily.  90 capsule  3  . omeprazole (PRILOSEC) 20 MG capsule 1-2 tabs by mouth per day  180 capsule  3  . SUMAtriptan (IMITREX) 100 MG tablet Take 1 tablet (100 mg total) by mouth every 2 (two) hours as needed for migraine.  10 tablet  11  . topiramate (TOPAMAX) 50 MG tablet Take 1 tablet (50 mg total) by mouth 2 (two) times daily.  180 tablet  3   No current facility-administered  medications on file prior to visit.   Review of Systems All otherwise neg per pt     Objective:   Physical Exam BP 120/82  Pulse 61  Temp(Src) 97.9 F (36.6 C) (Oral)  Ht 5\' 11"  (1.803 m)  Wt 169 lb (76.658 kg)  BMI 23.58 kg/m2  SpO2 98% VS noted,  Constitutional: Pt appears well-developed and well-nourished.  HENT: Head: NCAT.  Right Ear: External ear normal.  Left Ear: External ear normal.  Eyes: Conjunctivae and EOM are normal. Pupils are equal, round, and reactive to light.  Neck: Normal range of motion. Neck supple.  Cardiovascular: Normal rate and regular rhythm.   Pulmonary/Chest: Effort normal and breath sounds normal.  Abd:  Soft, NT, non-distended, + BS Spine nontedner, but bilat lumbar paravertebral tender Neurological: Pt is alert. Not confused, motor 5/5, sens/dtr/gait intact  Skin: Skin is warm. No erythema.  Psychiatric: Pt behavior is normal. Thought content normal.     Assessment & Plan:

## 2013-07-28 NOTE — Assessment & Plan Note (Signed)
C/w marked strain likely due to overuse, for pain control, muscle relaxer prn, predpac asd,  to f/u any worsening symptoms or concerns

## 2013-07-28 NOTE — Patient Instructions (Signed)
Please take all new medication as prescribed - the pain medication, muscle relaxer and prednisone Please continue all other medications as before, and refills have been done if requested. You are given the work note

## 2013-07-29 ENCOUNTER — Other Ambulatory Visit: Payer: Self-pay | Admitting: Internal Medicine

## 2013-08-02 ENCOUNTER — Ambulatory Visit (INDEPENDENT_AMBULATORY_CARE_PROVIDER_SITE_OTHER): Payer: BC Managed Care – PPO | Admitting: Internal Medicine

## 2013-08-02 ENCOUNTER — Encounter: Payer: Self-pay | Admitting: Internal Medicine

## 2013-08-02 VITALS — BP 140/92 | HR 73 | Ht 70.0 in | Wt 174.4 lb

## 2013-08-02 DIAGNOSIS — M62830 Muscle spasm of back: Secondary | ICD-10-CM

## 2013-08-02 DIAGNOSIS — M5380 Other specified dorsopathies, site unspecified: Secondary | ICD-10-CM

## 2013-08-02 MED ORDER — PREDNISONE 10 MG PO TABS: ORAL_TABLET | ORAL | Status: DC

## 2013-08-02 NOTE — Patient Instructions (Signed)
Back Exercises These exercises may help you when beginning to rehabilitate your injury. Your symptoms may resolve with or without further involvement from your physician, physical therapist or athletic trainer. While completing these exercises, remember:   Restoring tissue flexibility helps normal motion to return to the joints. This allows healthier, less painful movement and activity.  An effective stretch should be held for at least 30 seconds.  A stretch should never be painful. You should only feel a gentle lengthening or release in the stretched tissue. STRETCH  Extension, Prone on Elbows   Lie on your stomach on the floor, a bed will be too soft. Place your palms about shoulder width apart and at the height of your head.  Place your elbows under your shoulders. If this is too painful, stack pillows under your chest.  Allow your body to relax so that your hips drop lower and make contact more completely with the floor.  Hold this position for __________ seconds.  Slowly return to lying flat on the floor. Repeat __________ times. Complete this exercise __________ times per day.  RANGE OF MOTION  Extension, Prone Press Ups   Lie on your stomach on the floor, a bed will be too soft. Place your palms about shoulder width apart and at the height of your head.  Keeping your back as relaxed as possible, slowly straighten your elbows while keeping your hips on the floor. You may adjust the placement of your hands to maximize your comfort. As you gain motion, your hands will come more underneath your shoulders.  Hold this position __________ seconds.  Slowly return to lying flat on the floor. Repeat __________ times. Complete this exercise __________ times per day.  RANGE OF MOTION- Quadruped, Neutral Spine   Assume a hands and knees position on a firm surface. Keep your hands under your shoulders and your knees under your hips. You may place padding under your knees for comfort.  Drop  your head and point your tail bone toward the ground below you. This will round out your low back like an angry cat. Hold this position for __________ seconds.  Slowly lift your head and release your tail bone so that your back sags into a large arch, like an old horse.  Hold this position for __________ seconds.  Repeat this until you feel limber in your low back.  Now, find your "sweet spot." This will be the most comfortable position somewhere between the two previous positions. This is your neutral spine. Once you have found this position, tense your stomach muscles to support your low back.  Hold this position for __________ seconds. Repeat __________ times. Complete this exercise __________ times per day.  STRETCH  Flexion, Single Knee to Chest   Lie on a firm bed or floor with both legs extended in front of you.  Keeping one leg in contact with the floor, bring your opposite knee to your chest. Hold your leg in place by either grabbing behind your thigh or at your knee.  Pull until you feel a gentle stretch in your low back. Hold __________ seconds.  Slowly release your grasp and repeat the exercise with the opposite side. Repeat __________ times. Complete this exercise __________ times per day.  STRETCH - Hamstrings, Standing  Stand or sit and extend your right / left leg, placing your foot on a chair or foot stool  Keeping a slight arch in your low back and your hips straight forward.  Lead with your chest and   lean forward at the waist until you feel a gentle stretch in the back of your right / left knee or thigh. (When done correctly, this exercise requires leaning only a small distance.)  Hold this position for __________ seconds. Repeat __________ times. Complete this stretch __________ times per day. STRENGTHENING  Deep Abdominals, Pelvic Tilt   Lie on a firm bed or floor. Keeping your legs in front of you, bend your knees so they are both pointed toward the ceiling and  your feet are flat on the floor.  Tense your lower abdominal muscles to press your low back into the floor. This motion will rotate your pelvis so that your tail bone is scooping upwards rather than pointing at your feet or into the floor.  With a gentle tension and even breathing, hold this position for __________ seconds. Repeat __________ times. Complete this exercise __________ times per day.  STRENGTHENING  Abdominals, Crunches   Lie on a firm bed or floor. Keeping your legs in front of you, bend your knees so they are both pointed toward the ceiling and your feet are flat on the floor. Cross your arms over your chest.  Slightly tip your chin down without bending your neck.  Tense your abdominals and slowly lift your trunk high enough to just clear your shoulder blades. Lifting higher can put excessive stress on the low back and does not further strengthen your abdominal muscles.  Control your return to the starting position. Repeat __________ times. Complete this exercise __________ times per day.  STRENGTHENING  Quadruped, Opposite UE/LE Lift   Assume a hands and knees position on a firm surface. Keep your hands under your shoulders and your knees under your hips. You may place padding under your knees for comfort.  Find your neutral spine and gently tense your abdominal muscles so that you can maintain this position. Your shoulders and hips should form a rectangle that is parallel with the floor and is not twisted.  Keeping your trunk steady, lift your right hand no higher than your shoulder and then your left leg no higher than your hip. Make sure you are not holding your breath. Hold this position __________ seconds.  Continuing to keep your abdominal muscles tense and your back steady, slowly return to your starting position. Repeat with the opposite arm and leg. Repeat __________ times. Complete this exercise __________ times per day. Document Released: 10/11/2005 Document  Revised: 12/16/2011 Document Reviewed: 01/05/2009 [employer] Patient Information 2014 [employer].  

## 2013-08-02 NOTE — Progress Notes (Signed)
Subjective:    Patient ID: ABDURRAHMAN Mathews, male    DOB: Oct 07, 1986, 27 y.o.   MRN: [id number]  HPI  Pt presents to the clinic today with c/o continued muscle spasms. This started after helping a friend move. He was seen by Dr. Jonny Ruiz 5 days ago for the same. He was given flexeril, prednisone taper and ultram as needed for sever pain. Since that time, he reports that his pred tape disintegrated in the bottle. He only took a few pills. He does not think that the meds got wet. He has not had much improvement with the flexeril and tramadol.  Review of Systems      Past Medical History  Diagnosis Date  . ANXIETY 10/14/2007    Qualifier: Diagnosis of  By: Jonny Ruiz MD, Len Blalock   . COMMON MIGRAINE 10/14/2007    Qualifier: Diagnosis of  By: Jonny Ruiz MD, Len Blalock   . DEPRESSION 10/14/2007    Qualifier: Diagnosis of  By: Jonny Ruiz MD, Len Blalock   . GERD 10/14/2007    Qualifier: Diagnosis of  By: Jonny Ruiz MD, Len Blalock   . Allergic rhinitis, cause unspecified 03/10/2013    Current Outpatient Prescriptions  Medication Sig Dispense Refill  . clonazePAM (KLONOPIN) 0.5 MG tablet Take 1 tablet (0.5 mg total) by mouth 2 (two) times daily as needed for anxiety.  60 tablet  2  . cyclobenzaprine (FLEXERIL) 5 MG tablet Take 1 tablet (5 mg total) by mouth 3 (three) times daily as needed for muscle spasms.  60 tablet  1  . fexofenadine (ALLEGRA) 180 MG tablet Take 1 tablet (180 mg total) by mouth daily.  90 tablet  3  . FLUoxetine (PROZAC) 40 MG capsule Take 1 capsule (40 mg total) by mouth daily.  90 capsule  3  . omeprazole (PRILOSEC) 20 MG capsule TAKE 1-2 CAPSULES BY MOUTH DAILY  180 capsule  1  . predniSONE (DELTASONE) 10 MG tablet 3 tabs by mouth per day for 3 days,2tabs per day for 3 days,1tab per day for 3 days  18 tablet  0  . SUMAtriptan (IMITREX) 100 MG tablet TAKE 1 TABLET BY MOUTH EVERY 2 HOURS AS NEEDED FOR MIGRAINE  10 tablet  0  . topiramate (TOPAMAX) 50 MG tablet Take 1 tablet (50 mg total) by mouth 2 (two) times daily.   180 tablet  3  . traMADol (ULTRAM) 50 MG tablet Take 1 tablet (50 mg total) by mouth every 6 (six) hours as needed for pain.  60 tablet  1   No current facility-administered medications for this visit.    Allergies  Allergen Reactions  . Cefaclor     Family History  Problem Relation Age of Onset  . Hypertension Mother   . Migraines Mother   . Depression Father     History   Social History  . Marital Status: Married    Spouse Name: N/A    Number of Children: N/A  . Years of Education: N/A   Occupational History  . Not on file.   Social History Main Topics  . Smoking status: Never Smoker   . Smokeless tobacco: Never Used  . Alcohol Use: Yes  . Drug Use: No  . Sexual Activity: Not on file   Other Topics Concern  . Not on file   Social History Narrative  . No narrative on file     Constitutional: Denies fever, malaise, fatigue, headache or abrupt weight changes.  Musculoskeletal: Pt reports muscle spasms of lower back.  Denies decrease in range of motion, difficulty with gait, or joint pain and swelling.  Neurological: Denies dizziness, difficulty with memory, difficulty with speech or problems with balance and coordination.   No other specific complaints in a complete review of systems (except as listed in HPI above).  Objective:   Physical Exam  BP 140/92  Pulse 73  Ht 5\' 10"  (1.778 m)  Wt 174 lb 6 oz (79.096 kg)  BMI 25.02 kg/m2  SpO2 98% Wt Readings from Last 3 Encounters:  08/02/13 174 lb 6 oz (79.096 kg)  07/28/13 169 lb (76.658 kg)  03/10/13 173 lb 8 oz (78.699 kg)    General: Appears his stated age, well developed, well nourished in NAD. Cardiovascular: Normal rate and rhythm. S1,S2 noted.  No murmur, rubs or gallops noted. No JVD or BLE edema. No carotid bruits noted. Pulmonary/Chest: Normal effort and positive vesicular breath sounds. No respiratory distress. No wheezes, rales or ronchi noted.  Musculoskeletal: Normal range of motion. No signs  of joint swelling. No difficulty with gait. Tender to palpation of the lower back. Neurological: Alert and oriented. Cranial nerves II-XII intact. Coordination normal. +DTRs bilaterally.   BMET    Component Value Date/Time   NA 139 07/21/2012 1611   K 3.9 07/21/2012 1611   CL 103 07/21/2012 1611   CO2 29 07/21/2012 1611   GLUCOSE 90 07/21/2012 1611   BUN 13 07/21/2012 1611   CREATININE 1.0 07/21/2012 1611   CALCIUM 9.0 07/21/2012 1611   GFRNONAA 96.39 12/11/2009 1543   GFRAA 133 10/25/2008 1705    Lipid Panel     Component Value Date/Time   CHOL 140 07/21/2012 1611   CHOL 139 07/21/2012 1611   TRIG 73.0 07/21/2012 1611   TRIG 74.0 07/21/2012 1611   HDL 49.10 07/21/2012 1611   HDL 48.50 07/21/2012 1611   CHOLHDL 3 07/21/2012 1611   CHOLHDL 3 07/21/2012 1611   VLDL 14.6 07/21/2012 1611   VLDL 14.8 07/21/2012 1611   LDLCALC 76 07/21/2012 1611   LDLCALC 76 07/21/2012 1611    CBC    Component Value Date/Time   WBC 7.0 07/21/2012 1611   RBC 4.70 07/21/2012 1611   HGB 14.3 07/21/2012 1611   HCT 43.1 07/21/2012 1611   PLT 216.0 07/21/2012 1611   MCV 91.7 07/21/2012 1611   MCHC 33.2 07/21/2012 1611   RDW 12.4 07/21/2012 1611   LYMPHSABS 1.0 07/21/2012 1611   MONOABS 0.6 07/21/2012 1611   EOSABS 0.0 07/21/2012 1611   BASOSABS 0.0 07/21/2012 1611    Hgb A1C No results found for this basename: HGBA1C         Assessment & Plan:   Muscle spasms of back:  Increase your flexeril to 10 mg TID prn Continue ultram but only if really needed Offered pt referral to PT- will place today Will refill pred taper  RTC as needed or if pain persist or worsens

## 2013-08-03 ENCOUNTER — Telehealth: Payer: Self-pay | Admitting: Internal Medicine

## 2013-08-03 NOTE — Telephone Encounter (Signed)
A user error has taken place.

## 2013-08-04 ENCOUNTER — Encounter: Payer: Self-pay | Admitting: Internal Medicine

## 2013-08-04 ENCOUNTER — Ambulatory Visit (INDEPENDENT_AMBULATORY_CARE_PROVIDER_SITE_OTHER): Payer: BC Managed Care – PPO | Admitting: Internal Medicine

## 2013-08-04 ENCOUNTER — Telehealth: Payer: Self-pay | Admitting: Internal Medicine

## 2013-08-04 VITALS — BP 130/90 | HR 90 | Temp 98.3°F | Ht 70.0 in | Wt 170.0 lb

## 2013-08-04 DIAGNOSIS — F3289 Other specified depressive episodes: Secondary | ICD-10-CM

## 2013-08-04 DIAGNOSIS — F411 Generalized anxiety disorder: Secondary | ICD-10-CM

## 2013-08-04 DIAGNOSIS — M5380 Other specified dorsopathies, site unspecified: Secondary | ICD-10-CM

## 2013-08-04 DIAGNOSIS — M62830 Muscle spasm of back: Secondary | ICD-10-CM

## 2013-08-04 DIAGNOSIS — F329 Major depressive disorder, single episode, unspecified: Secondary | ICD-10-CM

## 2013-08-04 DIAGNOSIS — M549 Dorsalgia, unspecified: Secondary | ICD-10-CM

## 2013-08-04 MED ORDER — PREDNISONE 10 MG PO TABS: ORAL_TABLET | ORAL | Status: DC

## 2013-08-04 MED ORDER — OMEPRAZOLE 20 MG PO CPDR: 20.0000 mg | DELAYED_RELEASE_CAPSULE | Freq: Two times a day (BID) | ORAL | Status: DC

## 2013-08-04 MED ORDER — FLUOXETINE HCL 40 MG PO CAPS: 40.0000 mg | ORAL_CAPSULE | Freq: Every day | ORAL | Status: DC

## 2013-08-04 MED ORDER — DIAZEPAM 5 MG PO TABS: ORAL_TABLET | ORAL | Status: DC

## 2013-08-04 MED ORDER — HYDROCODONE-ACETAMINOPHEN 5-325 MG PO TABS: 1.0000 | ORAL_TABLET | Freq: Four times a day (QID) | ORAL | Status: DC | PRN

## 2013-08-04 MED ORDER — SUMATRIPTAN SUCCINATE 100 MG PO TABS: 100.0000 mg | ORAL_TABLET | ORAL | Status: DC | PRN

## 2013-08-04 NOTE — Assessment & Plan Note (Signed)
Not felt to be a signficant issue at this time on current meds,  to f/u any worsening symptoms or concerns

## 2013-08-04 NOTE — Telephone Encounter (Signed)
10.29.2014   pts wife called in regarding insurance forms that [place] should have.  Pt would like these forms filled out and faxed to [phone] and Attn: Jones Bales.  Please contact Pt or Cala Bradford when completed.

## 2013-08-04 NOTE — Progress Notes (Signed)
Subjective:    Patient ID: Ernest Mathews, male    DOB: 1985-11-27, 27 y.o.   MRN: [id number]  HPI  Here to f/u with wife and baby who mentions pt income is all they have and hopes he gets better, c/o persistent back pain unfortunately essentially no change with current tx, though still has not been able to take the predpack as asked.  Pain still located across the lower back, radiates up bilat paraspinal left > right to the neck, no recent trauma or fever but muscles feel hard;  Has physical job.  No prior hx of spine issue.  No bowel or bladder change, fever, wt loss,  worsening LE pain/numbness/weakness, gait change or falls. Denies worsening depressive symptoms, suicidal ideation, or panic Past Medical History  Diagnosis Date  . ANXIETY 10/14/2007    Qualifier: Diagnosis of  By: Jonny Ruiz MD, Len Blalock   . COMMON MIGRAINE 10/14/2007    Qualifier: Diagnosis of  By: Jonny Ruiz MD, Len Blalock   . DEPRESSION 10/14/2007    Qualifier: Diagnosis of  By: Jonny Ruiz MD, Len Blalock   . GERD 10/14/2007    Qualifier: Diagnosis of  By: Jonny Ruiz MD, Len Blalock   . Allergic rhinitis, cause unspecified 03/10/2013   Past Surgical History  Procedure Laterality Date  . Hand surgery    . Foot surgury      reports that he has never smoked. He has never used smokeless tobacco. He reports that he drinks alcohol. He reports that he does not use illicit drugs. family history includes Depression in his father; Hypertension in his mother; Migraines in his mother. Allergies  Allergen Reactions  . Cefaclor    Current Outpatient Prescriptions on File Prior to Visit  Medication Sig Dispense Refill  . clonazePAM (KLONOPIN) 0.5 MG tablet Take 1 tablet (0.5 mg total) by mouth 2 (two) times daily as needed for anxiety.  60 tablet  2  . cyclobenzaprine (FLEXERIL) 5 MG tablet Take 1 tablet (5 mg total) by mouth 3 (three) times daily as needed for muscle spasms.  60 tablet  1  . fexofenadine (ALLEGRA) 180 MG tablet Take 1 tablet (180 mg total) by mouth daily.   90 tablet  3  . topiramate (TOPAMAX) 50 MG tablet Take 1 tablet (50 mg total) by mouth 2 (two) times daily.  180 tablet  3  . traMADol (ULTRAM) 50 MG tablet Take 1 tablet (50 mg total) by mouth every 6 (six) hours as needed for pain.  60 tablet  1   No current facility-administered medications on file prior to visit.   Review of Systems  Constitutional: Negative for unexpected weight change, or unusual diaphoresis  HENT: Negative for tinnitus.   Eyes: Negative for photophobia and visual disturbance.  Respiratory: Negative for choking and stridor.   Gastrointestinal: Negative for vomiting and blood in stool.  Genitourinary: Negative for hematuria and decreased urine volume.  Musculoskeletal: Negative for acute joint swelling Skin: Negative for color change and wound.  Neurological: Negative for tremors and numbness other than noted  Psychiatric/Behavioral: Negative for decreased concentration or  hyperactivity.       Objective:   Physical Exam BP 130/90  Pulse 90  Temp(Src) 98.3 F (36.8 C) (Oral)  Ht 5\' 10"  (1.778 m)  Wt 170 lb (77.111 kg)  BMI 24.39 kg/m2  SpO2 98% VS noted,  Constitutional: Pt appears well-developed and well-nourished.  HENT: Head: NCAT.  Right Ear: External ear normal.  Left Ear: External ear normal.  Eyes:  Conjunctivae and EOM are normal. Pupils are equal, round, and reactive to light.  Neck: Normal range of motion. Neck supple.  Cardiovascular: Normal rate and regular rhythm.   Pulmonary/Chest: Effort normal and breath sounds normal.  Abd:  Soft, NT, non-distended, + BS Neurological: Pt is alert. Not confused , motor 5/5, sens/dtr/gait intact but stiff and slow to flex and extend the lumbar with reduced ROM Spine nontender Marked tender bilat lumbar paravertebral spasm, with spasm noted all along the spine to the  Left neck and occiput as well, no change from previous/no improved Skin: Skin is warm. No erythema.  Psychiatric: Pt behavior is normal.  Thought content normal. mild nervous, no evidence depressed affect     Assessment & Plan:

## 2013-08-04 NOTE — Assessment & Plan Note (Signed)
stable overall by history and exam, and pt to continue medical treatment as before,  to f/u any worsening symptoms or concerns 

## 2013-08-04 NOTE — Patient Instructions (Addendum)
Please take all new medication as prescribed - the hydrocodone, valium OK to stop the tramadol and flexeril Please continue all other medications as before (except to not take the klonopin when taking the valium), and refills have been done if requested - the prednisone We will hold off on xray today, but you should have this done if not improving in the next 3-5 days We should also consider referral to Dr Smith/sports medicine if not improved as well  You are given the work note, with a return to work date of Aug 09, 2013  We will need to fill out the insurance forms when needed

## 2013-08-04 NOTE — Assessment & Plan Note (Signed)
Exam essentially no change, no neuro change, c/w rather severe unremitting msk spasm, for hydrocodone prn short term, valium bid prn (hold the klonopin), andpredpack asd, declines films today but will consider films lumbar and refer to Dr Smith/sports med if persists, work note given

## 2013-08-05 DIAGNOSIS — Z0279 Encounter for issue of other medical certificate: Secondary | ICD-10-CM

## 2013-08-05 NOTE — Telephone Encounter (Signed)
Forms completed, given to Dr. Jonny Ruiz to sign

## 2013-08-12 ENCOUNTER — Other Ambulatory Visit: Payer: Self-pay

## 2013-08-16 ENCOUNTER — Encounter: Payer: Self-pay | Admitting: Family Medicine

## 2013-08-16 ENCOUNTER — Ambulatory Visit (INDEPENDENT_AMBULATORY_CARE_PROVIDER_SITE_OTHER)
Admission: RE | Admit: 2013-08-16 | Discharge: 2013-08-16 | Disposition: A | Discharge status: Home / Self Care | Payer: BC Managed Care – PPO | Source: Ambulatory Visit | Attending: Family Medicine | Admitting: Family Medicine

## 2013-08-16 ENCOUNTER — Ambulatory Visit (INDEPENDENT_AMBULATORY_CARE_PROVIDER_SITE_OTHER): Payer: BC Managed Care – PPO | Admitting: Family Medicine

## 2013-08-16 VITALS — BP 146/84 | HR 90 | Wt 175.0 lb

## 2013-08-16 DIAGNOSIS — M545 Low back pain, unspecified: Secondary | ICD-10-CM

## 2013-08-16 MED ORDER — GABAPENTIN 100 MG PO CAPS: 200.0000 mg | ORAL_CAPSULE | Freq: Every day | ORAL | Status: DC

## 2013-08-16 MED ORDER — MELOXICAM 15 MG PO TABS: 15.0000 mg | ORAL_TABLET | Freq: Every day | ORAL | Status: DC

## 2013-08-16 NOTE — Assessment & Plan Note (Signed)
Patient does have low back pain that seems to be worsening over the course last 3 weeks. Patient is a young 27 year old gentleman that should not be having this with fairly good course strength. Patient does complain of some radiculopathy which I do not know if it's true radiculopathy. Do feel the patient also having history of erectile dysfunction we will actually get further imaging. This is affecting his job and he was given a note to avoid lifting more than 35 pounds for the next 2 weeks. Patient was given home exercise program, anti-inflammatories and Neurontin at night to see if this would be beneficial. This could also be related to patient's anxiety and depression and we may want to consider treatment with any SNRI such as Effexor in the long run. I will try to discuss this with Dr. Jonny Ruiz, patient's primary care provider. Patient will come back and see me again in 2 weeks to see how he is doing. In addition this patient will also start formal physical therapy.

## 2013-08-16 NOTE — Progress Notes (Signed)
  I'm seeing this patient by the request  of:  Oliver Barre, MD  CC: Back pain  HPI: Patient is a 27 year old gentleman coming in with a complaint of back pain. Patient had this back pain started back in October. He was first seen by his primary care provider on October 22. Patient noticed the pain after helping a friend move. Patient states that worse the pain seems to be in the lower back bilaterally with some mild radiculopathy to the right foot. Patient denies though any numbness or weakness. Patient does have a physically demanding job and states that it's been somewhat difficult to do his job secondary to this discomfort. Patient describes the pain is more of a dull aching sensation that can have a sharp pain with certain movements. Patient denies any bowel or bladder changes, fever, weight loss or any gait abnormalities. Patient does have a history of erectile dysfunction but states that this was prior to injury. Patient is a severity of 7/10.   Past medical, surgical, family and social history reviewed. Medications reviewed all in the electronic medical record.   Review of Systems: No headache, visual changes, nausea, vomiting, diarrhea, constipation, dizziness, abdominal pain, skin rash, fevers, chills, night sweats, weight loss, swollen lymph nodes, body aches, joint swelling, muscle aches, chest pain, shortness of breath, mood changes.   Objective:    Blood pressure 146/84, pulse 90, weight 175 lb (79.379 kg), SpO2 97.00%.   General: No apparent distress alert and oriented x3 mood and affect normal, dressed appropriately.  HEENT: Pupils equal, extraocular movements intact Respiratory: Patient's speak in full sentences and does not appear short of breath Cardiovascular: No lower extremity edema, non tender, no erythema Skin: Warm dry intact with no signs of infection or rash on extremities or on axial skeleton. Abdomen: Soft nontender Neuro: Cranial nerves II through XII are intact,  neurovascularly intact in all extremities with 2+ DTRs and 2+ pulses. Lymph: No lymphadenopathy of posterior or anterior cervical chain or axillae bilaterally.  Gait normal with good balance and coordination.  MSK: Non tender with full range of motion and good stability and symmetric strength and tone of shoulders, elbows, wrist, hip, knee and ankles bilaterally.  Back Exam:  Inspection: Unremarkable  Motion: Flexion 25 deg, Extension 35 deg, Side Bending to 45 deg bilaterally,  Rotation to 45 deg bilaterally  SLR laying: Negative  XSLR laying: Negative  Palpable tenderness: Tender to palpation over the lumbar spine including the spinous process. FABER: Positive bilaterally. Sensory change: Gross sensation intact to all lumbar and sacral dermatomes.  Reflexes: 2+ at both patellar tendons, 2+ at achilles tendons, Babinski's downgoing.  Strength at foot  Plantar-flexion: 5/5 Dorsi-flexion: 5/5 Eversion: 5/5 Inversion: 5/5  Leg strength  Quad: 5/5 Hamstring: 5/5 Hip flexor: 5/5 Hip abductors: 5/5  Gait unremarkable.   Impression and Recommendations:     This case required medical decision making of moderate complexity.

## 2013-08-16 NOTE — Patient Instructions (Signed)
Nice to meet you Meloxicam daily for 10 days then as needed.  Nuerontin 100mg  at night for first week then 200mg  nightly thereafter.  Exercises daily Read handout.  Get xrays down stairs, no news is good news Come back in 2 weeks.

## 2013-08-18 ENCOUNTER — Telehealth: Payer: Self-pay | Admitting: Internal Medicine

## 2013-08-18 DIAGNOSIS — M549 Dorsalgia, unspecified: Secondary | ICD-10-CM

## 2013-08-18 NOTE — Telephone Encounter (Signed)
Called the patient left a detailed message. 

## 2013-08-18 NOTE — Telephone Encounter (Signed)
Pt saw Dr. Katrinka Blazing.  He is still having back pain.  He is asking for a cortisone injection in the back or a referral to some one to help the back pain.

## 2013-08-18 NOTE — Telephone Encounter (Signed)
Ok - refer ortho  - done per emr

## 2013-08-24 ENCOUNTER — Telehealth: Payer: Self-pay | Admitting: *Deleted
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
## 2013-08-25 NOTE — Telephone Encounter (Signed)
left detailed message on VM advising of MDs message
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
## 2013-08-30 ENCOUNTER — Ambulatory Visit: Payer: BC Managed Care – PPO | Admitting: Family Medicine

## 2013-09-04 ENCOUNTER — Emergency Department (HOSPITAL_COMMUNITY)
Admission: EM | Admit: 2013-09-04 | Discharge: 2013-09-04 | Disposition: A | Discharge status: Home / Self Care | Payer: BC Managed Care – PPO | Attending: Emergency Medicine | Admitting: Emergency Medicine

## 2013-09-04 ENCOUNTER — Emergency Department (HOSPITAL_COMMUNITY): Payer: BC Managed Care – PPO

## 2013-09-04 ENCOUNTER — Encounter (HOSPITAL_COMMUNITY): Payer: Self-pay | Admitting: *Deleted

## 2013-09-04 DIAGNOSIS — R112 Nausea with vomiting, unspecified: Secondary | ICD-10-CM | POA: Insufficient documentation

## 2013-09-04 DIAGNOSIS — F411 Generalized anxiety disorder: Secondary | ICD-10-CM | POA: Insufficient documentation

## 2013-09-04 DIAGNOSIS — F3289 Other specified depressive episodes: Secondary | ICD-10-CM | POA: Insufficient documentation

## 2013-09-04 DIAGNOSIS — Z79899 Other long term (current) drug therapy: Secondary | ICD-10-CM | POA: Insufficient documentation

## 2013-09-04 DIAGNOSIS — N23 Unspecified renal colic: Secondary | ICD-10-CM

## 2013-09-04 DIAGNOSIS — J309 Allergic rhinitis, unspecified: Secondary | ICD-10-CM | POA: Insufficient documentation

## 2013-09-04 DIAGNOSIS — K219 Gastro-esophageal reflux disease without esophagitis: Secondary | ICD-10-CM | POA: Insufficient documentation

## 2013-09-04 DIAGNOSIS — F329 Major depressive disorder, single episode, unspecified: Secondary | ICD-10-CM | POA: Insufficient documentation

## 2013-09-04 DIAGNOSIS — N201 Calculus of ureter: Secondary | ICD-10-CM

## 2013-09-04 LAB — URINALYSIS, ROUTINE W REFLEX MICROSCOPIC
Bilirubin Urine: NEGATIVE
Glucose, UA: NEGATIVE mg/dL
Ketones, ur: 15 mg/dL — AB
Leukocytes, UA: NEGATIVE
Nitrite: NEGATIVE
Protein, ur: 30 mg/dL — AB
Specific Gravity, Urine: 1.021 (ref 1.005–1.030)
Urobilinogen, UA: 0.2 mg/dL (ref 0.0–1.0)
pH: 8.5 — ABNORMAL HIGH (ref 5.0–8.0)

## 2013-09-04 LAB — BASIC METABOLIC PANEL
BUN: 18 mg/dL (ref 6–23)
CO2: 22 mEq/L (ref 19–32)
Calcium: 9.8 mg/dL (ref 8.4–10.5)
Chloride: 101 mEq/L (ref 96–112)
Creatinine, Ser: 0.94 mg/dL (ref 0.50–1.35)
GFR calc Af Amer: >90 mL/min (ref >90)
GFR calc non Af Amer: >90 mL/min (ref >90)
Glucose, Bld: 154 mg/dL — ABNORMAL HIGH (ref 70–99)
Potassium: 4.2 mEq/L (ref 3.5–5.1)
Sodium: 140 mEq/L (ref 135–145)

## 2013-09-04 LAB — CBC WITH DIFFERENTIAL/PLATELET
Basophils Absolute: 0 10*3/uL (ref 0.0–0.1)
Basophils Relative: 0 % (ref 0–1)
Eosinophils Absolute: 0.1 10*3/uL (ref 0.0–0.7)
Eosinophils Relative: 0 % (ref 0–5)
HCT: 41.8 % (ref 39.0–52.0)
Hemoglobin: 15.2 g/dL (ref 13.0–17.0)
Lymphocytes Relative: 7 % — ABNORMAL LOW (ref 12–46)
Lymphs Abs: 0.9 10*3/uL (ref 0.7–4.0)
MCH: 31 pg (ref 26.0–34.0)
MCHC: 36.4 g/dL — ABNORMAL HIGH (ref 30.0–36.0)
MCV: 85.3 fL (ref 78.0–100.0)
Monocytes Absolute: 0.8 10*3/uL (ref 0.1–1.0)
Monocytes Relative: 6 % (ref 3–12)
Neutro Abs: 11.1 10*3/uL — ABNORMAL HIGH (ref 1.7–7.7)
Neutrophils Relative %: 87 % — ABNORMAL HIGH (ref 43–77)
Platelets: 219 10*3/uL (ref 150–400)
RBC: 4.9 MIL/uL (ref 4.22–5.81)
RDW: 12 % (ref 11.5–15.5)
WBC: 12.8 10*3/uL — ABNORMAL HIGH (ref 4.0–10.5)

## 2013-09-04 LAB — LIPASE, BLOOD: Lipase: 24 U/L (ref 11–59)

## 2013-09-04 LAB — URINE MICROSCOPIC-ADD ON

## 2013-09-04 MED ORDER — OXYCODONE-ACETAMINOPHEN 5-325 MG PO TABS: 1.0000 | ORAL_TABLET | ORAL | Status: DC | PRN

## 2013-09-04 MED ORDER — SODIUM CHLORIDE 0.9 % IV BOLUS (SEPSIS)
1000.0000 mL | Freq: Once | INTRAVENOUS | Status: AC
  Administered 2013-09-04: 1000 mL via INTRAVENOUS

## 2013-09-04 MED ORDER — HYDROMORPHONE HCL PF 1 MG/ML IJ SOLN
1.0000 mg | Freq: Once | INTRAMUSCULAR | Status: AC
  Administered 2013-09-04: 1 mg via INTRAVENOUS
  Filled 2013-09-04: qty 1

## 2013-09-04 MED ORDER — KETOROLAC TROMETHAMINE 30 MG/ML IJ SOLN
30.0000 mg | Freq: Once | INTRAMUSCULAR | Status: AC
  Administered 2013-09-04: 30 mg via INTRAVENOUS
  Filled 2013-09-04: qty 1

## 2013-09-04 MED ORDER — ONDANSETRON HCL 4 MG/2ML IJ SOLN
4.0000 mg | Freq: Once | INTRAMUSCULAR | Status: AC
  Administered 2013-09-04: 4 mg via INTRAVENOUS
  Filled 2013-09-04: qty 2

## 2013-09-04 MED ORDER — ONDANSETRON HCL 4 MG PO TABS: 4.0000 mg | ORAL_TABLET | Freq: Four times a day (QID) | ORAL | Status: DC

## 2013-09-04 MED ORDER — OXYCODONE-ACETAMINOPHEN 5-325 MG PO TABS
2.0000 | ORAL_TABLET | Freq: Once | ORAL | Status: AC
  Administered 2013-09-04: 2 via ORAL
  Filled 2013-09-04: qty 2

## 2013-09-04 NOTE — ED Notes (Signed)
Patient transported to CT 

## 2013-09-04 NOTE — ED Notes (Signed)
PT reports Flank pain started this AM. Pain 10/10.

## 2013-09-08 NOTE — ED Provider Notes (Signed)
CSN: [id number]     Arrival date & time 09/04/13  1033 History   First MD Initiated Contact with Patient 09/04/13 1056     Chief Complaint  Patient presents with  . Flank Pain   (Consider location/radiation/quality/duration/timing/severity/associated sxs/prior Treatment) HPI  27 year old male with right flank and right lower quadrant pain. Symptom onset early this morning. Denies any trauma. The pain is sharp and very severe. Associated with nausea and vomiting. Was diaphoretic he at one point when the pain was especially worse. He denies any fever or chills though. No urinary complaints. No history similar pain. History of back pain, but states that current symptoms feel different than the pain is experienced in the past.  Past Medical History  Diagnosis Date  . ANXIETY 10/14/2007    Qualifier: Diagnosis of  By: Jonny Ruiz MD, Len Blalock   . COMMON MIGRAINE 10/14/2007    Qualifier: Diagnosis of  By: Jonny Ruiz MD, Len Blalock   . DEPRESSION 10/14/2007    Qualifier: Diagnosis of  By: Jonny Ruiz MD, Len Blalock   . GERD 10/14/2007    Qualifier: Diagnosis of  By: Jonny Ruiz MD, Len Blalock   . Allergic rhinitis, cause unspecified 03/10/2013   Past Surgical History  Procedure Laterality Date  . Hand surgery    . Foot surgury     Family History  Problem Relation Age of Onset  . Hypertension Mother   . Migraines Mother   . Depression Father    History  Substance Use Topics  . Smoking status: Never Smoker   . Smokeless tobacco: Never Used  . Alcohol Use: Yes    Review of Systems  All systems reviewed and negative, other than as noted in HPI.   Allergies  Cefaclor  Home Medications   Current Outpatient Rx  Name  Route  Sig  Dispense  Refill  . clonazePAM (KLONOPIN) 0.5 MG tablet   Oral   Take 1 tablet (0.5 mg total) by mouth 2 (two) times daily as needed for anxiety.   60 tablet   2   . fexofenadine (ALLEGRA) 180 MG tablet   Oral   Take 1 tablet (180 mg total) by mouth daily.   90 tablet   3   .  FLUoxetine (PROZAC) 40 MG capsule   Oral   Take 1 capsule (40 mg total) by mouth daily.   90 capsule   3   . meloxicam (MOBIC) 15 MG tablet   Oral   Take 1 tablet (15 mg total) by mouth daily.   30 tablet   0   . omeprazole (PRILOSEC) 20 MG capsule   Oral   Take 20 mg by mouth daily.         . SUMAtriptan (IMITREX) 100 MG tablet   Oral   Take 1 tablet (100 mg total) by mouth every 2 (two) hours as needed for migraine. May repeat in 2 hours if headache persists or recurs.   10 tablet   0   . ondansetron (ZOFRAN) 4 MG tablet   Oral   Take 1 tablet (4 mg total) by mouth every 6 (six) hours.   12 tablet   0   . oxyCODONE-acetaminophen (PERCOCET/ROXICET) 5-325 MG per tablet   Oral   Take 1-2 tablets by mouth every 4 (four) hours as needed for severe pain.   20 tablet   0    BP 106/52  Pulse 71  Temp(Src) 98.6 F (37 C) (Rectal)  Resp 16  Ht 5\' 10"  (1.778  m)  Wt 180 lb (81.647 kg)  BMI 25.83 kg/m2  SpO2 99% Physical Exam  Nursing note and vitals reviewed. Constitutional: He appears well-developed and well-nourished. He appears distressed.  Appears very uncomfortable. Laying on his side and actively retching.  HENT:  Head: Normocephalic and atraumatic.  Eyes: Conjunctivae are normal. Right eye exhibits no discharge. Left eye exhibits no discharge.  Neck: Neck supple.  Cardiovascular: Normal rate, regular rhythm and normal heart sounds.  Exam reveals no gallop and no friction rub.   No murmur heard. Pulmonary/Chest: Effort normal and breath sounds normal. No respiratory distress.  Abdominal: Soft. He exhibits no distension. There is no tenderness.  Genitourinary:  Right CVA tenderness  Musculoskeletal: He exhibits no edema and no tenderness.  Neurological: He is alert.  Skin: Skin is warm and dry.  Psychiatric: He has a normal mood and affect. His behavior is normal. Thought content normal.    ED Course  Procedures (including critical care time) Labs  Review Labs Reviewed  URINALYSIS, ROUTINE W REFLEX MICROSCOPIC - Abnormal; Notable for the following:    APPearance CLOUDY (*)    pH 8.5 (*)    Hgb urine dipstick LARGE (*)    Ketones, ur 15 (*)    Protein, ur 30 (*)    All other components within normal limits  BASIC METABOLIC PANEL - Abnormal; Notable for the following:    Glucose, Bld 154 (*)    All other components within normal limits  CBC WITH DIFFERENTIAL - Abnormal; Notable for the following:    WBC 12.8 (*)    MCHC 36.4 (*)    Neutrophils Relative % 87 (*)    Neutro Abs 11.1 (*)    Lymphocytes Relative 7 (*)    All other components within normal limits  LIPASE, BLOOD  URINE MICROSCOPIC-ADD ON   Imaging Review No results found.  Ct Abdomen Pelvis Wo Contrast  09/04/2013   CLINICAL DATA:  Right flank pain  EXAM: CT ABDOMEN AND PELVIS WITHOUT CONTRAST  TECHNIQUE: Multidetector CT imaging of the abdomen and pelvis was performed following the standard protocol without intravenous contrast.  COMPARISON:  None.  FINDINGS: Minimal dependent atelectasis posteriorly in the visualized left lung base. Unremarkable liver, gallbladder, spleen, adrenal glands, left kidney, pancreas, aorta, stomach, small bowel, colon. Unenhanced CT was performed per clinician order. Lack of IV contrast limits sensitivity and specificity, especially for evaluation of abdominal/pelvic solid viscera.  There is mild central distention of the right renal collecting system and mild right ureterectasis down to the level of a 2mm calculus, just proximal to the ureteral orifice. The urinary bladder is nondistended. No nephrolithiasis.  No ascites. No free air. No adenopathy localized. Disk protrusions L4-5 and L5-S1.  IMPRESSION: 1. Partially obstructing 2 mm distal right ureteral calculus.   Electronically Signed   By: Oley Balm M.D.   On: 09/04/2013 12:12   Dg Lumbar Spine Complete  08/16/2013   CLINICAL DATA:  Low back and lower extremity pain.  EXAM: LUMBAR  SPINE - COMPLETE 4+ VIEW  COMPARISON:  None.  FINDINGS: There is no evidence of lumbar spine fracture. Alignment is normal. Intervertebral disc spaces are maintained.  IMPRESSION: Negative.   Electronically Signed   By: Oley Balm M.D.   On: 08/16/2013 16:45   EKG Interpretation   None       MDM   1. Ureteral colic   2. Ureterolithiasis    27 year old male with right flank pain. CT significant for distal ureteral stone.  Symptoms currently controlled. Plan continued symptomatic control expectant management. Return precautions were discussed. Outpatient followup with urology otherwise.    Raeford Razor, MD 09/08/13 [id number]

## 2013-09-11 ENCOUNTER — Other Ambulatory Visit: Payer: Self-pay | Admitting: Internal Medicine

## 2013-09-11 ENCOUNTER — Encounter (HOSPITAL_COMMUNITY): Payer: Self-pay | Admitting: Emergency Medicine

## 2013-09-11 ENCOUNTER — Emergency Department (HOSPITAL_COMMUNITY)
Admission: EM | Admit: 2013-09-11 | Discharge: 2013-09-11 | Disposition: A | Discharge status: Home / Self Care | Payer: BC Managed Care – PPO | Attending: Emergency Medicine | Admitting: Emergency Medicine

## 2013-09-11 DIAGNOSIS — G43009 Migraine without aura, not intractable, without status migrainosus: Secondary | ICD-10-CM | POA: Insufficient documentation

## 2013-09-11 DIAGNOSIS — Z79899 Other long term (current) drug therapy: Secondary | ICD-10-CM | POA: Insufficient documentation

## 2013-09-11 DIAGNOSIS — F3289 Other specified depressive episodes: Secondary | ICD-10-CM | POA: Insufficient documentation

## 2013-09-11 DIAGNOSIS — N200 Calculus of kidney: Secondary | ICD-10-CM | POA: Insufficient documentation

## 2013-09-11 DIAGNOSIS — K219 Gastro-esophageal reflux disease without esophagitis: Secondary | ICD-10-CM | POA: Insufficient documentation

## 2013-09-11 DIAGNOSIS — Z791 Long term (current) use of non-steroidal anti-inflammatories (NSAID): Secondary | ICD-10-CM | POA: Insufficient documentation

## 2013-09-11 DIAGNOSIS — F411 Generalized anxiety disorder: Secondary | ICD-10-CM | POA: Insufficient documentation

## 2013-09-11 DIAGNOSIS — F329 Major depressive disorder, single episode, unspecified: Secondary | ICD-10-CM | POA: Insufficient documentation

## 2013-09-11 LAB — URINALYSIS, ROUTINE W REFLEX MICROSCOPIC
Bilirubin Urine: NEGATIVE
Glucose, UA: NEGATIVE mg/dL
Hgb urine dipstick: NEGATIVE
Ketones, ur: NEGATIVE mg/dL
Leukocytes, UA: NEGATIVE
Nitrite: NEGATIVE
Protein, ur: NEGATIVE mg/dL
Specific Gravity, Urine: 1.036 — ABNORMAL HIGH (ref 1.005–1.030)
Urobilinogen, UA: 1 mg/dL (ref 0.0–1.0)

## 2013-09-11 MED ORDER — HYDROMORPHONE HCL PF 1 MG/ML IJ SOLN
1.0000 mg | Freq: Once | INTRAMUSCULAR | Status: AC
  Administered 2013-09-11: 1 mg via INTRAVENOUS
  Filled 2013-09-11: qty 1

## 2013-09-11 MED ORDER — TAMSULOSIN HCL 0.4 MG PO CAPS: 0.4000 mg | ORAL_CAPSULE | Freq: Every day | ORAL | Status: DC

## 2013-09-11 MED ORDER — SODIUM CHLORIDE 0.9 % IV SOLN
Freq: Once | INTRAVENOUS | Status: AC
  Administered 2013-09-11: 09:00:00 via INTRAVENOUS

## 2013-09-11 MED ORDER — OXYCODONE-ACETAMINOPHEN 5-325 MG PO TABS: 2.0000 | ORAL_TABLET | ORAL | Status: DC | PRN

## 2013-09-11 MED ORDER — ONDANSETRON 4 MG PO TBDP: 4.0000 mg | ORAL_TABLET | Freq: Three times a day (TID) | ORAL | Status: DC | PRN

## 2013-09-11 MED ORDER — KETOROLAC TROMETHAMINE 30 MG/ML IJ SOLN
30.0000 mg | Freq: Once | INTRAMUSCULAR | Status: AC
  Administered 2013-09-11: 30 mg via INTRAVENOUS
  Filled 2013-09-11: qty 1

## 2013-09-11 MED ORDER — ONDANSETRON HCL 4 MG/2ML IJ SOLN
4.0000 mg | Freq: Once | INTRAMUSCULAR | Status: AC
  Administered 2013-09-11: 4 mg via INTRAVENOUS
  Filled 2013-09-11: qty 2

## 2013-09-11 NOTE — ED Provider Notes (Signed)
CSN: [id number]     Arrival date & time 09/11/13  4098 History   First MD Initiated Contact with Patient 09/11/13 0902     Chief Complaint  Patient presents with  . Abdominal Pain    Right    (Consider location/radiation/quality/duration/timing/severity/associated sxs/prior Treatment) Patient is a 27 y.o. male presenting with abdominal pain. The history is provided by the patient. No language interpreter was used.  Abdominal Pain Pain location:  RLQ Pain quality: fullness and stabbing   Pain severity:  Severe Onset quality:  Sudden Duration:  1 week Timing:  Sporadic Progression:  Worsening Chronicity:  Recurrent Relieved by:  Nothing Worsened by:  Nothing tried Pt diagnosed one week ago with a kidney stone.   Pt reports increased pain and out of percocet yesterday  Past Medical History  Diagnosis Date  . ANXIETY 10/14/2007    Qualifier: Diagnosis of  By: Jonny Ruiz MD, Len Blalock   . COMMON MIGRAINE 10/14/2007    Qualifier: Diagnosis of  By: Jonny Ruiz MD, Len Blalock   . DEPRESSION 10/14/2007    Qualifier: Diagnosis of  By: Jonny Ruiz MD, Len Blalock   . GERD 10/14/2007    Qualifier: Diagnosis of  By: Jonny Ruiz MD, Len Blalock   . Allergic rhinitis, cause unspecified 03/10/2013   Past Surgical History  Procedure Laterality Date  . Hand surgery    . Foot surgury     Family History  Problem Relation Age of Onset  . Hypertension Mother   . Migraines Mother   . Depression Father    History  Substance Use Topics  . Smoking status: Never Smoker   . Smokeless tobacco: Never Used  . Alcohol Use: Yes    Review of Systems  Gastrointestinal: Positive for abdominal pain.  All other systems reviewed and are negative.    Allergies  Cefaclor  Home Medications   Current Outpatient Rx  Name  Route  Sig  Dispense  Refill  . clonazePAM (KLONOPIN) 0.5 MG tablet   Oral   Take 1 tablet (0.5 mg total) by mouth 2 (two) times daily as needed for anxiety.   60 tablet   2   . fexofenadine (ALLEGRA) 180 MG tablet  Oral   Take 1 tablet (180 mg total) by mouth daily.   90 tablet   3   . FLUoxetine (PROZAC) 40 MG capsule   Oral   Take 1 capsule (40 mg total) by mouth daily.   90 capsule   3   . meloxicam (MOBIC) 15 MG tablet   Oral   Take 1 tablet (15 mg total) by mouth daily.   30 tablet   0   . omeprazole (PRILOSEC) 20 MG capsule   Oral   Take 20 mg by mouth daily.         . ondansetron (ZOFRAN) 4 MG tablet   Oral   Take 1 tablet (4 mg total) by mouth every 6 (six) hours.   12 tablet   0   . oxyCODONE-acetaminophen (PERCOCET/ROXICET) 5-325 MG per tablet   Oral   Take 1-2 tablets by mouth every 4 (four) hours as needed for severe pain.   20 tablet   0   . SUMAtriptan (IMITREX) 100 MG tablet   Oral   Take 1 tablet (100 mg total) by mouth every 2 (two) hours as needed for migraine. May repeat in 2 hours if headache persists or recurs.   10 tablet   0    BP 119/73  Pulse 57  Temp(Src) 97 F (36.1 C) (Oral)  Resp 16  Ht 5\' 10"  (1.778 m)  Wt 180 lb (81.647 kg)  BMI 25.83 kg/m2  SpO2 96% Physical Exam  Constitutional: He appears well-developed and well-nourished.  HENT:  Head: Normocephalic and atraumatic.  Eyes: Pupils are equal, round, and reactive to light.  Neck: Normal range of motion.  Cardiovascular: Normal rate.   Pulmonary/Chest: Effort normal and breath sounds normal.  Abdominal: Soft. Bowel sounds are normal.  Neurological: He is alert.  Skin: Skin is warm.    ED Course  Procedures (including critical care time) Labs Review Labs Reviewed  URINALYSIS, ROUTINE W REFLEX MICROSCOPIC - Abnormal; Notable for the following:    Specific Gravity, Urine 1.036 (*)    All other components within normal limits   Imaging Review No results found.  EKG Interpretation   None       MDM   1. Kidney stone    Pt given torodol, zofran and dilaudid.   Pt feels better.   Pt given rx for zofran, percocet and flomax.  I advised to follow up with Urology for  evaluation   Elson Areas, PA-C 09/11/13 1046

## 2013-09-11 NOTE — ED Provider Notes (Signed)
Medical screening examination/treatment/procedure(s) were performed by non-physician practitioner and as supervising physician I was immediately available for consultation/collaboration.    Nelia Shi, MD 09/11/13 1049

## 2013-09-11 NOTE — ED Notes (Signed)
Pt c/o of Right abdominal pain since last Saturday (09/04/13) from a kidney stone, pt was seen in [facility].  Pt c/o of nausea without vomiting.  Pt is taking Zofran and Percocet for pain, pt is out of the Percocet.  Pt pain is 8/10.

## 2013-09-11 NOTE — ED Notes (Signed)
PA at bedside.

## 2013-09-13 NOTE — Telephone Encounter (Signed)
Ok to refill? Last OV 11.10.14 Last filled 6.4.14

## 2013-09-13 NOTE — Telephone Encounter (Signed)
Done hardcopy to [facility]bin  

## 2013-09-14 NOTE — Telephone Encounter (Signed)
Faxed hardcopy to [facility]

## 2013-10-14 ENCOUNTER — Encounter: Payer: Self-pay | Admitting: Internal Medicine

## 2013-10-14 ENCOUNTER — Ambulatory Visit (INDEPENDENT_AMBULATORY_CARE_PROVIDER_SITE_OTHER): Payer: BC Managed Care – PPO | Admitting: Internal Medicine

## 2013-10-14 ENCOUNTER — Other Ambulatory Visit (INDEPENDENT_AMBULATORY_CARE_PROVIDER_SITE_OTHER): Payer: BC Managed Care – PPO

## 2013-10-14 VITALS — BP 130/80 | HR 54 | Temp 97.3°F | Wt 175.1 lb

## 2013-10-14 DIAGNOSIS — Z0000 Encounter for general adult medical examination without abnormal findings: Secondary | ICD-10-CM

## 2013-10-14 DIAGNOSIS — R109 Unspecified abdominal pain: Secondary | ICD-10-CM

## 2013-10-14 DIAGNOSIS — M519 Unspecified thoracic, thoracolumbar and lumbosacral intervertebral disc disorder: Secondary | ICD-10-CM | POA: Insufficient documentation

## 2013-10-14 DIAGNOSIS — J019 Acute sinusitis, unspecified: Secondary | ICD-10-CM

## 2013-10-14 DIAGNOSIS — F411 Generalized anxiety disorder: Secondary | ICD-10-CM

## 2013-10-14 DIAGNOSIS — Z23 Encounter for immunization: Secondary | ICD-10-CM

## 2013-10-14 DIAGNOSIS — N201 Calculus of ureter: Secondary | ICD-10-CM

## 2013-10-14 HISTORY — DX: Unspecified thoracic, thoracolumbar and lumbosacral intervertebral disc disorder: M51.9

## 2013-10-14 LAB — URINALYSIS, ROUTINE W REFLEX MICROSCOPIC
Hgb urine dipstick: NEGATIVE
Ketones, ur: NEGATIVE
Leukocytes, UA: NEGATIVE
Nitrite: NEGATIVE
Total Protein, Urine: NEGATIVE
UROBILINOGEN UA: 0.2 (ref 0.0–1.0)
Urine Glucose: NEGATIVE
pH: 6 (ref 5.0–8.0)

## 2013-10-14 MED ORDER — SUMATRIPTAN SUCCINATE 100 MG PO TABS: 100.0000 mg | ORAL_TABLET | ORAL | Status: DC | PRN

## 2013-10-14 MED ORDER — CLONAZEPAM 0.5 MG PO TABS: ORAL_TABLET | ORAL | Status: DC

## 2013-10-14 MED ORDER — HYDROCODONE-ACETAMINOPHEN 5-325 MG PO TABS: 1.0000 | ORAL_TABLET | Freq: Four times a day (QID) | ORAL | Status: DC | PRN

## 2013-10-14 MED ORDER — LEVAQUIN 250 MG PO TABS: 250.0000 mg | ORAL_TABLET | Freq: Every day | ORAL | Status: DC

## 2013-10-14 MED ORDER — TAMSULOSIN HCL 0.4 MG PO CAPS
0.4000 mg | ORAL_CAPSULE | Freq: Every day | ORAL | Status: DC
Start: 2013-10-14 — End: 2015-03-15

## 2013-10-14 NOTE — Assessment & Plan Note (Signed)
Mild to mod, for antibx course,  to f/u any worsening symptoms or concerns 

## 2013-10-14 NOTE — Assessment & Plan Note (Signed)
With persistent pain essentially no change, only 2 mm but with partial obstrution by CT; doubt infection today, for Urine studies, re-start flomax, hydrocodone prn, stat U/S r/o hydro and refer urology

## 2013-10-14 NOTE — Assessment & Plan Note (Signed)

## 2013-10-14 NOTE — Progress Notes (Signed)
Subjective:    Patient ID: Ernest Mathews, male    DOB: 07/21/1986, 28 y.o.   MRN: [id number]  HPI  Here for wellness and f/u;  Overall doing ok;  Pt denies CP, worsening SOB, DOE, wheezing, orthopnea, PND, worsening LE edema, palpitations, dizziness or syncope.  Pt denies neurological change such as new headache, facial or extremity weakness.  Pt denies polydipsia, polyuria, or low sugar symptoms. Pt states overall good compliance with treatment and medications, good tolerability, and has been trying to follow lower cholesterol diet.  Pt denies worsening depressive symptoms, suicidal ideation or panic, but has ongoing worsening recent anxiety.  No fever, night sweats, wt loss, loss of appetite, or other constitutional symptoms.  Pt states good ability with ADL's, has low fall risk, home safety reviewed and adequate, no other significant changes in hearing or vision, and only occasionally active with exercise.  Incidentally  -  Here with 2-3 days acute onset fever, facial pain, pressure, headache, general weakness and malaise, and greenish d/c, with mild ST and cough. Past Medical History  Diagnosis Date  . ANXIETY 10/14/2007    Qualifier: Diagnosis of  By: Jonny RuizJohn MD, Len BlalockJames W   . COMMON MIGRAINE 10/14/2007    Qualifier: Diagnosis of  By: Jonny RuizJohn MD, Len BlalockJames W   . DEPRESSION 10/14/2007    Qualifier: Diagnosis of  By: Jonny RuizJohn MD, Len BlalockJames W   . GERD 10/14/2007    Qualifier: Diagnosis of  By: Jonny RuizJohn MD, Len BlalockJames W   . Allergic rhinitis, cause unspecified 03/10/2013  . Lumbar disc disease 10/14/2013   Past Surgical History  Procedure Laterality Date  . Hand surgery    . Foot surgury      reports that he has never smoked. He has never used smokeless tobacco. He reports that he drinks alcohol. He reports that he does not use illicit drugs. family history includes Depression in his father; Hypertension in his mother; Migraines in his mother. Allergies  Allergen Reactions  . Cefaclor Other (See Comments)    Unknown  (childhood)   Current Outpatient Prescriptions on File Prior to Visit  Medication Sig Dispense Refill  . fexofenadine (ALLEGRA) 180 MG tablet Take 1 tablet (180 mg total) by mouth daily.  90 tablet  3  . FLUoxetine (PROZAC) 40 MG capsule Take 1 capsule (40 mg total) by mouth daily.  90 capsule  3  . meloxicam (MOBIC) 15 MG tablet Take 1 tablet (15 mg total) by mouth daily.  30 tablet  0  . omeprazole (PRILOSEC) 20 MG capsule Take 20 mg by mouth daily.      . ondansetron (ZOFRAN ODT) 4 MG disintegrating tablet Take 1 tablet (4 mg total) by mouth every 8 (eight) hours as needed for nausea or vomiting.  20 tablet  0   No current facility-administered medications on file prior to visit.   Review of Systems Constitutional: Negative for diaphoresis, activity change, appetite change or unexpected weight change.  HENT: Negative for hearing loss, ear pain, facial swelling, mouth sores and neck stiffness.   Eyes: Negative for pain, redness and visual disturbance.  Respiratory: Negative for shortness of breath and wheezing.   Cardiovascular: Negative for chest pain and palpitations.  Gastrointestinal: Negative for diarrhea, blood in stool, abdominal distention or other pain Genitourinary: Negative for hematuria, flank pain or change in urine volume.  Musculoskeletal: Negative for myalgias and joint swelling.  Skin: Negative for color change and wound.  Neurological: Negative for syncope and numbness. other than noted Hematological:  Negative for adenopathy.  Psychiatric/Behavioral: Negative for hallucinations, self-injury, decreased concentration and agitation.      Objective:   Physical Exam BP 130/80  Pulse 54  Temp(Src) 97.3 F (36.3 C) (Oral)  Wt 175 lb 1.9 oz (79.434 kg)  SpO2 99% VS noted, mild ill Constitutional: Pt is oriented to person, place, and time. Appears well-developed and well-nourished.  Head: Normocephalic and atraumatic.  Right Ear: External ear normal.  Left Ear:  External ear normal.  Nose: Nose normal.  Mouth/Throat: Oropharynx is clear and moist.  Bilat tm's with mild erythema.  Max sinus areas mild tender.  Pharynx with mild erythema, no exudate Eyes: Conjunctivae and EOM are normal. Pupils are equal, round, and reactive to light.  Neck: Normal range of motion. Neck supple. No JVD present. No tracheal deviation present.  Cardiovascular: Normal rate, regular rhythm, normal heart sounds and intact distal pulses.   Pulmonary/Chest: Effort normal and breath sounds normal.  Abdominal: Soft. Bowel sounds are normal. There is no tenderness. No HSM  Musculoskeletal: Normal range of motion. Exhibits no edema.  Lymphadenopathy:  Has no cervical adenopathy.  Neurological: Pt is alert and oriented to person, place, and time. Pt has normal reflexes. No cranial nerve deficit.  Skin: Skin is warm and dry. No rash noted.  Psychiatric:  Has  normal mood and affect. Behavior is normal.     Assessment & Plan:

## 2013-10-14 NOTE — Progress Notes (Signed)
Pre-visit discussion using our clinic review tool. No additional management support is needed unless otherwise documented below in the visit note.  

## 2013-10-14 NOTE — Patient Instructions (Addendum)
You had the flu shot today, and tetanus shot Please take all new medication as prescribed  - the antibiotic Please continue all other medications as before, and refills have been done if requested - the flomax and klonopin and pain medication Please have the pharmacy call with any other refills you may need. Please continue your efforts at being more active, low cholesterol diet, and weight control. You are otherwise up to date with prevention measures today.  Please go to the LAB in the Basement (turn left off the elevator) for the tests to be done today - just the urine tests today  You will be contacted regarding the referral for: urology  [facility]  Please keep your appointments with your specialists as you have planned - orthopedic for your back  Please return in 6 months, or sooner if needed

## 2013-10-15 ENCOUNTER — Other Ambulatory Visit: Payer: Self-pay | Admitting: Internal Medicine

## 2013-10-15 LAB — URINE CULTURE
Colony Count: NO GROWTH
Organism ID, Bacteria: NO GROWTH

## 2013-10-15 MED ORDER — AZITHROMYCIN 250 MG PO TABS: ORAL_TABLET | ORAL | Status: DC

## 2013-10-17 NOTE — Assessment & Plan Note (Signed)
For klonopin prn,  to f/u any worsening symptoms or concerns  

## 2013-10-18 ENCOUNTER — Ambulatory Visit
Admission: RE | Admit: 2013-10-18 | Discharge: 2013-10-18 | Disposition: A | Discharge status: Home / Self Care | Payer: BC Managed Care – PPO | Source: Ambulatory Visit | Attending: Internal Medicine | Admitting: Internal Medicine

## 2013-10-18 ENCOUNTER — Encounter (HOSPITAL_COMMUNITY): Payer: BC Managed Care – PPO

## 2013-10-18 DIAGNOSIS — N201 Calculus of ureter: Secondary | ICD-10-CM

## 2013-10-18 DIAGNOSIS — R109 Unspecified abdominal pain: Secondary | ICD-10-CM

## 2013-10-26 ENCOUNTER — Telehealth: Payer: Self-pay | Admitting: Internal Medicine

## 2013-10-26 NOTE — Telephone Encounter (Signed)
Negative ultrasound, please remind pt results are on Mychart since that date, and he can see there as well

## 2013-10-26 NOTE — Telephone Encounter (Signed)
Called pt no answer LMOM with md response,,,/lmb 

## 2013-10-26 NOTE — Telephone Encounter (Signed)
Pt called request the result of the ultrasound that was done 10/18/13. Please call pt.

## 2013-11-04 ENCOUNTER — Other Ambulatory Visit: Payer: Self-pay | Admitting: Internal Medicine

## 2013-11-05 NOTE — Telephone Encounter (Signed)
Done erx 

## 2014-04-22 ENCOUNTER — Other Ambulatory Visit: Payer: Self-pay

## 2014-04-22 MED ORDER — CLONAZEPAM 0.5 MG PO TABS: ORAL_TABLET | ORAL | Status: DC

## 2014-04-22 NOTE — Telephone Encounter (Signed)
#   60, no refill 

## 2014-04-22 NOTE — Telephone Encounter (Signed)
Faxed hardcopy to [facility] [address] [place] 

## 2014-09-25 ENCOUNTER — Emergency Department (HOSPITAL_COMMUNITY): Payer: BC Managed Care – PPO

## 2014-09-25 ENCOUNTER — Encounter (HOSPITAL_COMMUNITY): Payer: Self-pay | Admitting: Nurse Practitioner

## 2014-09-25 ENCOUNTER — Emergency Department (HOSPITAL_COMMUNITY)
Admission: EM | Admit: 2014-09-25 | Discharge: 2014-09-25 | Disposition: A | Discharge status: Home / Self Care | Payer: BC Managed Care – PPO | Attending: Emergency Medicine | Admitting: Emergency Medicine

## 2014-09-25 DIAGNOSIS — Z791 Long term (current) use of non-steroidal anti-inflammatories (NSAID): Secondary | ICD-10-CM | POA: Insufficient documentation

## 2014-09-25 DIAGNOSIS — Z792 Long term (current) use of antibiotics: Secondary | ICD-10-CM | POA: Insufficient documentation

## 2014-09-25 DIAGNOSIS — K219 Gastro-esophageal reflux disease without esophagitis: Secondary | ICD-10-CM | POA: Insufficient documentation

## 2014-09-25 DIAGNOSIS — M79671 Pain in right foot: Secondary | ICD-10-CM

## 2014-09-25 DIAGNOSIS — F329 Major depressive disorder, single episode, unspecified: Secondary | ICD-10-CM | POA: Insufficient documentation

## 2014-09-25 DIAGNOSIS — G43909 Migraine, unspecified, not intractable, without status migrainosus: Secondary | ICD-10-CM | POA: Insufficient documentation

## 2014-09-25 DIAGNOSIS — Z79899 Other long term (current) drug therapy: Secondary | ICD-10-CM | POA: Insufficient documentation

## 2014-09-25 DIAGNOSIS — F419 Anxiety disorder, unspecified: Secondary | ICD-10-CM | POA: Insufficient documentation

## 2014-09-25 MED ORDER — MELOXICAM 15 MG PO TABS: 15.0000 mg | ORAL_TABLET | Freq: Every day | ORAL | Status: DC

## 2014-09-25 MED ORDER — HYDROCODONE-ACETAMINOPHEN 5-325 MG PO TABS: 1.0000 | ORAL_TABLET | Freq: Four times a day (QID) | ORAL | Status: DC | PRN

## 2014-09-25 NOTE — ED Provider Notes (Signed)
CSN: [id number]     Arrival date & time 09/25/14  1905 History  This chart was scribed for non-physician practitioner, Antony MaduraKelly Renard Caperton, PA-C working with Flint MelterElliott L Wentz, MD by Greggory StallionKayla Andersen, ED scribe. This patient was seen in room TR10C/TR10C and the patient's care was started at 8:08 PM.   Chief Complaint  Patient presents with  . Foot Pain   The history is provided by the patient. No language interpreter was used.    HPI Comments: Ernest LittlesJames L Weyers Jr. is a 28 y.o. male who presents to the Emergency Department complaining of aching, throbbing right foot pain with associated swelling and tingling that started 2 weeks ago. Denies known injury but states he is on his feet a lot at work. Pain is worse in the mornings and after sitting for long periods of time but gets better throughout the day. States his toes are very red in the mornings but it resolves throughout the day. Pt has taken ibuprofen with little relief. Denies fever, hemoptysis, calf swelling, numbness, and weakness. Denies recent surgeries or hospitalizations.    Past Medical History  Diagnosis Date  . ANXIETY 10/14/2007    Qualifier: Diagnosis of  By: Jonny RuizJohn MD, Len BlalockJames W   . COMMON MIGRAINE 10/14/2007    Qualifier: Diagnosis of  By: Jonny RuizJohn MD, Len BlalockJames W   . DEPRESSION 10/14/2007    Qualifier: Diagnosis of  By: Jonny RuizJohn MD, Len BlalockJames W   . GERD 10/14/2007    Qualifier: Diagnosis of  By: Jonny RuizJohn MD, Len BlalockJames W   . Allergic rhinitis, cause unspecified 03/10/2013  . Lumbar disc disease 10/14/2013   Past Surgical History  Procedure Laterality Date  . Hand surgery    . Foot surgury     Family History  Problem Relation Age of Onset  . Hypertension Mother   . Migraines Mother   . Depression Father    History  Substance Use Topics  . Smoking status: Never Smoker   . Smokeless tobacco: Never Used  . Alcohol Use: Yes    Review of Systems  Constitutional: Negative for fever.  Cardiovascular: Negative for leg swelling.  Musculoskeletal: Positive for joint  swelling and arthralgias.  All other systems reviewed and are negative.  Allergies  Cefaclor  Home Medications   Prior to Admission medications   Medication Sig Start Date End Date Taking? Authorizing Provider  azithromycin (ZITHROMAX Z-PAK) 250 MG tablet Use as directed 10/15/13   Corwin LevinsJames W John, MD  clonazePAM (KLONOPIN) 0.5 MG tablet TAKE 1 TABLET BY MOUTH TWICE DAILY AS NEEDED FOR ANXIETY 04/22/14   Pecola LawlessWilliam F Hopper, MD  fexofenadine (ALLEGRA) 180 MG tablet Take 1 tablet (180 mg total) by mouth daily. 07/28/13   Corwin LevinsJames W John, MD  FLUoxetine (PROZAC) 40 MG capsule Take 1 capsule (40 mg total) by mouth daily. 08/04/13   Corwin LevinsJames W John, MD  HYDROcodone-acetaminophen (NORCO/VICODIN) 5-325 MG per tablet Take 1 tablet by mouth every 6 (six) hours as needed for severe pain. 09/25/14   Antony MaduraKelly Genesee Nase, PA-C  LEVAQUIN 250 MG tablet Take 1 tablet (250 mg total) by mouth daily. 10/14/13   Corwin LevinsJames W John, MD  meloxicam (MOBIC) 15 MG tablet Take 1 tablet (15 mg total) by mouth daily. For mild to moderate pain and swelling 09/25/14   Antony MaduraKelly Sania Noy, PA-C  omeprazole (PRILOSEC) 20 MG capsule Take 20 mg by mouth daily.    Historical Provider, MD  ondansetron (ZOFRAN ODT) 4 MG disintegrating tablet Take 1 tablet (4 mg total) by mouth every  8 (eight) hours as needed for nausea or vomiting. 09/11/13   Elson Areas, PA-C  SUMAtriptan (IMITREX) 100 MG tablet Take 1 tablet (100 mg total) by mouth every 2 (two) hours as needed for migraine. May repeat in 2 hours if headache persists or recurs. 10/14/13   Corwin Levins, MD  tamsulosin (FLOMAX) 0.4 MG CAPS capsule Take 1 capsule (0.4 mg total) by mouth daily. 10/14/13   Corwin Levins, MD  topiramate (TOPAMAX) 50 MG tablet TAKE 1 TABLET BY MOUTH TWICE DAILY 11/04/13   Corwin Levins, MD   BP 121/56 mmHg  Pulse 57  Temp(Src) 98.2 F (36.8 C) (Oral)  Resp 12  SpO2 100%   Physical Exam  Constitutional: He is oriented to person, place, and time. He appears well-developed and  well-nourished. No distress.  Nontoxic/nonseptic appearing  HENT:  Head: Normocephalic and atraumatic.  Eyes: Conjunctivae and EOM are normal. No scleral icterus.  Neck: Normal range of motion.  Cardiovascular: Normal rate, regular rhythm and intact distal pulses.   DP and PT pulses 2+ in right lower extremity  Pulmonary/Chest: Effort normal. No respiratory distress.  Musculoskeletal: Normal range of motion. He exhibits tenderness.  Normal range of motion of right foot and ankle. No tenderness to palpation of right calf. No pitting edema, effusion, crepitus, or deformity to right ankle or foot. Patient with tenderness to palpation to his right plantar fascia as well as the medial aspect of his right foot. No erythema, heat to touch, or red streaking.  Neurological: He is alert and oriented to person, place, and time. He exhibits normal muscle tone. Coordination normal.  Sensation to light touch intact. Patient able to wiggle all toes of right foot  Skin: Skin is warm and dry. No rash noted. He is not diaphoretic. No erythema. No pallor.  Psychiatric: He has a normal mood and affect. His behavior is normal.  Nursing note and vitals reviewed.   ED Course  Procedures (including critical care time)  DIAGNOSTIC STUDIES: Oxygen Saturation is 97% on RA, normal by my interpretation.    COORDINATION OF CARE: 8:14 PM-Advised pt of xray results. Discussed treatment plan which includes mobic and an ankle brace with pt at bedside and pt agreed to plan. Will give pt an orthopedic referral and advised him to follow up if symptoms do not start resolving.   Labs Review Labs Reviewed - No data to display  Imaging Review Dg Ankle Complete Right  09/25/2014   CLINICAL DATA:  Right ankle pain and swelling for 2 weeks, no trauma  EXAM: RIGHT ANKLE - COMPLETE 3+ VIEW  COMPARISON:  None.  FINDINGS: There is no evidence of fracture, dislocation, or joint effusion. There is no evidence of arthropathy or other  focal bone abnormality. Soft tissues are unremarkable.  IMPRESSION: Negative.   Electronically Signed   By: Christiana Pellant M.D.   On: 09/25/2014 19:59   Dg Foot Complete Right  09/25/2014   CLINICAL DATA:  Initial evaluation for acute pain.  EXAM: RIGHT FOOT COMPLETE - 3+ VIEW  COMPARISON:  None.  FINDINGS: No acute fracture or dislocation. Joint spaces well maintained without evidence of significant degenerative or erosive arthropathy. Osseous mineralization is normal.  No soft tissue abnormality.  No radiopaque foreign body.  IMPRESSION: No acute abnormality about the right foot.   Electronically Signed   By: Rise Mu M.D.   On: 09/25/2014 19:58     EKG Interpretation None      MDM  Final diagnoses:  Right foot pain    28 year old male present to the emergency department for persistent right foot pain 2 weeks. Patient is neurovascularly intact. No evidence of septic joint or soft tissue infection. History and physical exam findings most consistent with osteoarthritis, though plantar fascia is also possibility as patient is on his feet often and does not always wear shoes with adequate are support. Have recommended outpatient management with Mobic and icing. Orthotic insoles advised as well. Short course of Norco given for severe pain. Will refer to orthopedics for further evaluation of symptoms. Return precautions discussed and provided. Patient agreeable to plan with no unaddressed concerns.  I personally performed the services described in this documentation, which was scribed in my presence. The recorded information has been reviewed and is accurate.   Filed Vitals:   09/25/14 1914 09/25/14 2018  BP: 136/94 121/56  Pulse: 67 57  Temp: 98.2 F (36.8 C)   TempSrc: Oral   Resp: 16 12  SpO2: 97% 100%     Antony MaduraKelly Tanush Drees, PA-C 09/25/14 2148  Flint MelterElliott L Wentz, MD 09/25/14 [phone]

## 2014-09-25 NOTE — ED Notes (Signed)
Humes, PA at bedside.  

## 2014-09-25 NOTE — Discharge Instructions (Signed)
Recommend the use of an ankle brace for stability, but ALSO orthotic insoles for arch support and foot stability. Take Mobic as prescribed. You may take Norco in addition to Mobic for severe pain. Follow up with an orthopedist for further evaluation of symptoms. Return to the ED as needed if symptoms worsen.  Osteoarthritis Osteoarthritis is a disease that causes soreness and inflammation of a joint. It occurs when the cartilage at the affected joint wears down. Cartilage acts as a cushion, covering the ends of bones where they meet to form a joint. Osteoarthritis is the most common form of arthritis. It often occurs in older people. The joints affected most often by this condition include those in the:  Ends of the fingers.  Thumbs.  Neck.  Lower back.  Knees.  Hips. CAUSES  Over time, the cartilage that covers the ends of bones begins to wear away. This causes bone to rub on bone, producing pain and stiffness in the affected joints.  RISK FACTORS Certain factors can increase your chances of having osteoarthritis, including:  Older age.  Excessive body weight.  Overuse of joints.  Previous joint injury. SIGNS AND SYMPTOMS   Pain, swelling, and stiffness in the joint.  Over time, the joint may lose its normal shape.  Small deposits of bone (osteophytes) may grow on the edges of the joint.  Bits of bone or cartilage can break off and float inside the joint space. This may cause more pain and damage. DIAGNOSIS  Your health care provider will do a physical exam and ask about your symptoms. Various tests may be ordered, such as:  X-rays of the affected joint.  An MRI scan.  Blood tests to rule out other types of arthritis.  Joint fluid tests. This involves using a needle to draw fluid from the joint and examining the fluid under a microscope. TREATMENT  Goals of treatment are to control pain and improve joint function. Treatment plans may include:  A prescribed exercise  program that allows for rest and joint relief.  A weight control plan.  Pain relief techniques, such as:  Properly applied heat and cold.  Electric pulses delivered to nerve endings under the skin (transcutaneous electrical nerve stimulation [TENS]).  Massage.  Certain nutritional supplements.  Medicines to control pain, such as:  Acetaminophen.  Nonsteroidal anti-inflammatory drugs (NSAIDs), such as naproxen.  Narcotic or central-acting agents, such as tramadol.  Corticosteroids. These can be given orally or as an injection.  Surgery to reposition the bones and relieve pain (osteotomy) or to remove loose pieces of bone and cartilage. Joint replacement may be needed in advanced states of osteoarthritis. HOME CARE INSTRUCTIONS   Take medicines only as directed by your health care provider.  Maintain a healthy weight. Follow your health care provider's instructions for weight control. This may include dietary instructions.  Exercise as directed. Your health care provider can recommend specific types of exercise. These may include:  Strengthening exercises. These are done to strengthen the muscles that support joints affected by arthritis. They can be performed with weights or with exercise bands to add resistance.  Aerobic activities. These are exercises, such as brisk walking or low-impact aerobics, that get your heart pumping.  Range-of-motion activities. These keep your joints limber.  Balance and agility exercises. These help you maintain daily living skills.  Rest your affected joints as directed by your health care provider.  Keep all follow-up visits as directed by your health care provider. SEEK MEDICAL CARE IF:  Your skin turns red.  You develop a rash in addition to your joint pain.  You have worsening joint pain.  You have a fever along with joint or muscle aches. SEEK IMMEDIATE MEDICAL CARE IF:  You have a significant loss of weight or  appetite.  You have night sweats. [place] of Arthritis and Musculoskeletal and Skin Diseases: www.niams.SouthExposed.es  [employer] on Aging: http://kim-miller.com/  American College of Rheumatology: www.rheumatology.org Document Released: 09/23/2005 Document Revised: 02/07/2014 Document Reviewed: 05/31/2013 [facility] Patient Information 2015 [place], [place]. This information is not intended to replace advice given to you by your health care provider. Make sure you discuss any questions you have with your health care provider.

## 2014-09-25 NOTE — ED Notes (Signed)
He c/o  R foot pain x 2 weeks. He does not remember injuring the foot. He states the foot is swollen. The pain is worse in the morning and after sitting down for a period of time.

## 2014-11-14 IMAGING — CT CT ABD-PELV W/O CM
2 of 4 series · 17 of 46 positions shown, 19 images · non-contrast
Comparison: None.

CLINICAL DATA: Right flank pain

EXAM:
CT ABDOMEN AND PELVIS WITHOUT CONTRAST
TECHNIQUE: Multidetector CT imaging of the abdomen and pelvis was performed
following the standard protocol without intravenous contrast.

[Series 2: routine · axial · 0.68mm/px · z∈[-524,-64]mm · 14 of 100 slices shown, 16 images]
[im 4/100  soft-tissue]
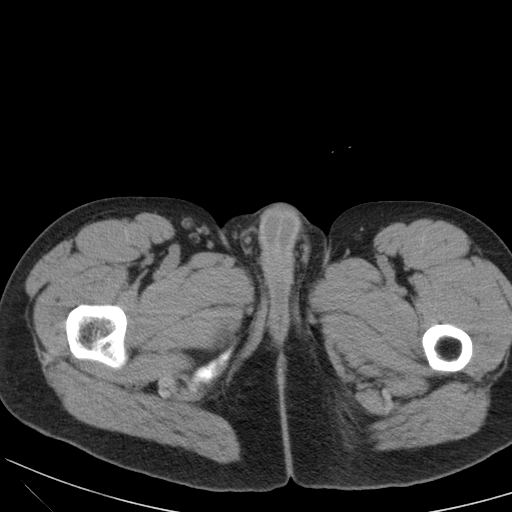
[im 4/100  bone]
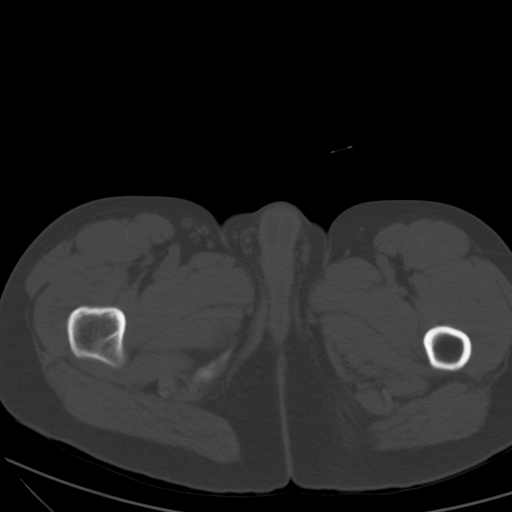
[im 12/100  soft-tissue]
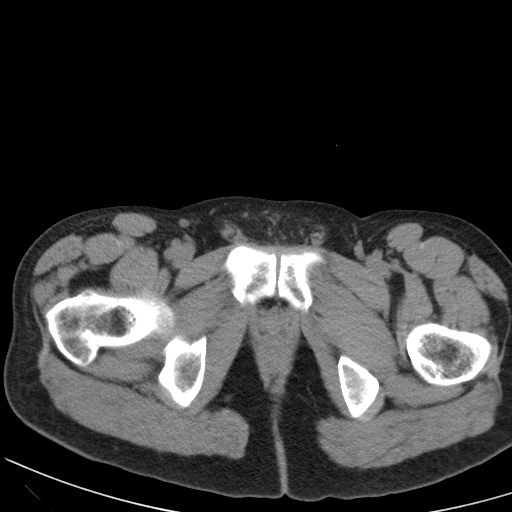
[im 20/100  soft-tissue]
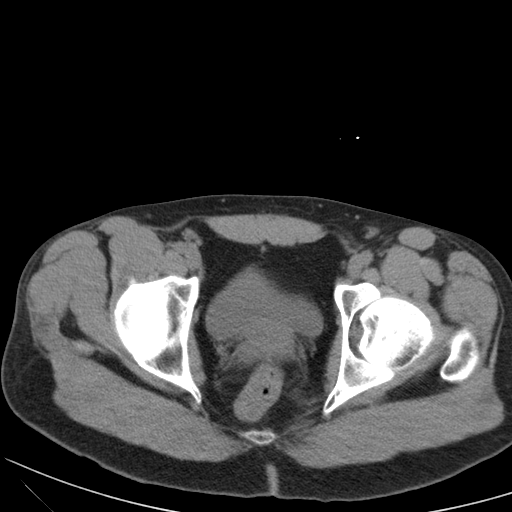
[im 27/100  soft-tissue]
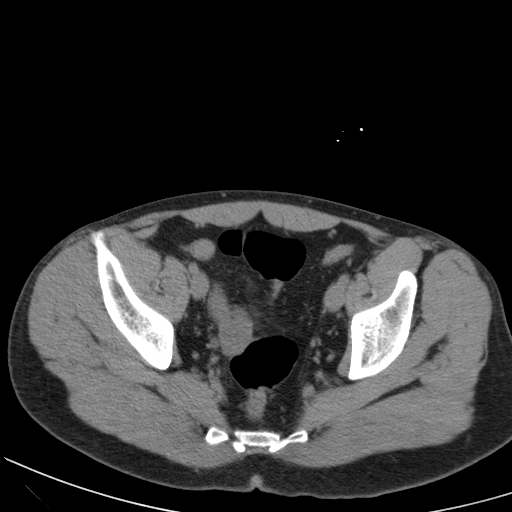
[im 35/100  soft-tissue]
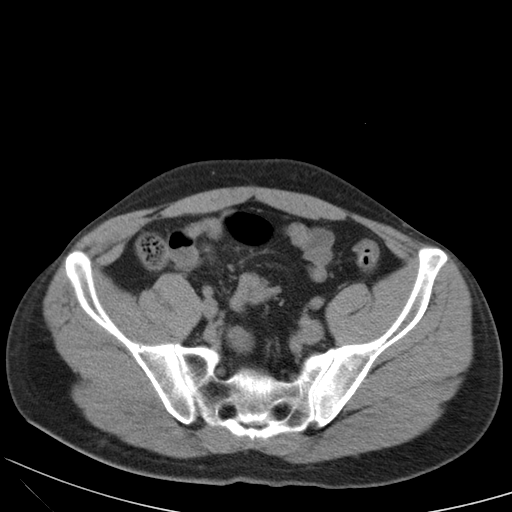
[im 39/100  soft-tissue]
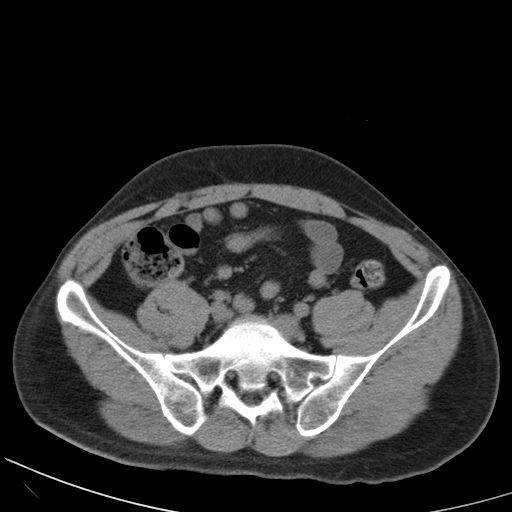
[im 46/100  soft-tissue]
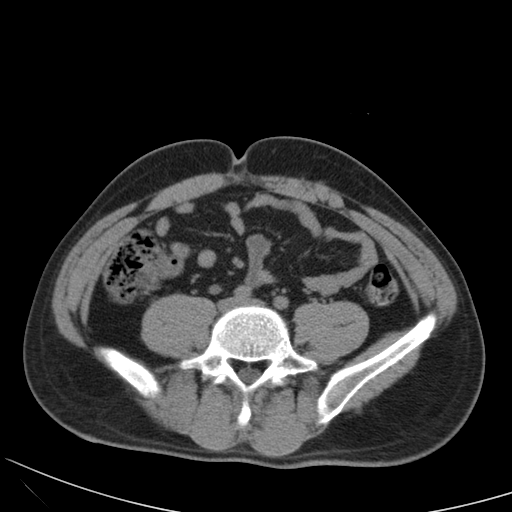
[im 54/100  soft-tissue]
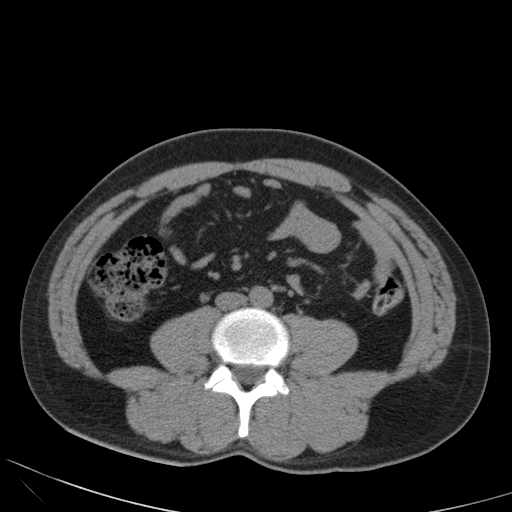
[im 61/100  soft-tissue]
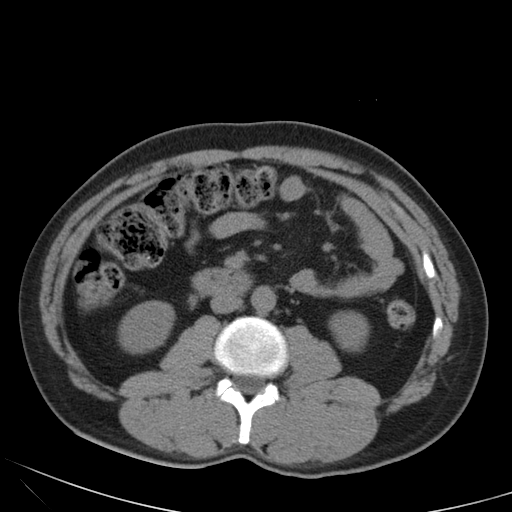
[im 61/100  bone]
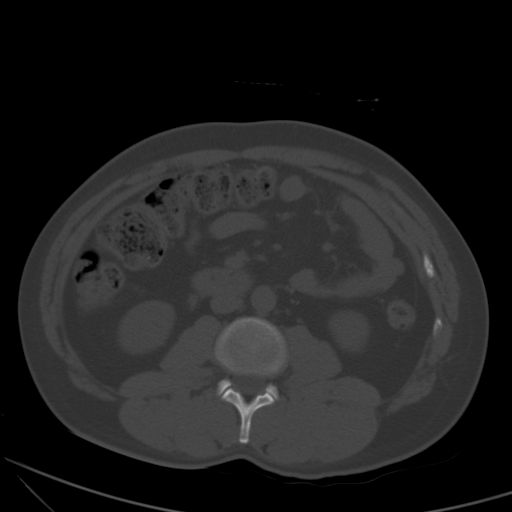
[im 65/100  soft-tissue]
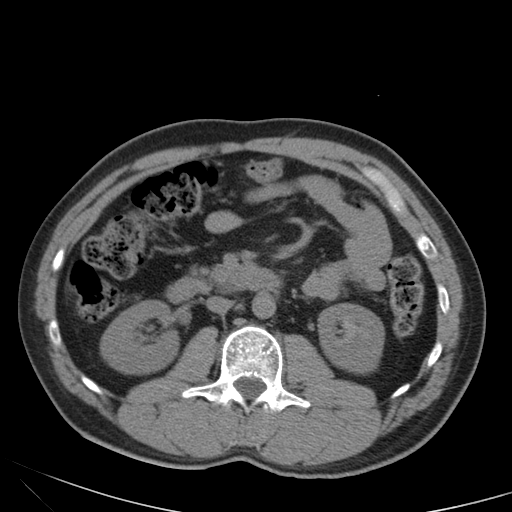
[im 73/100  soft-tissue]
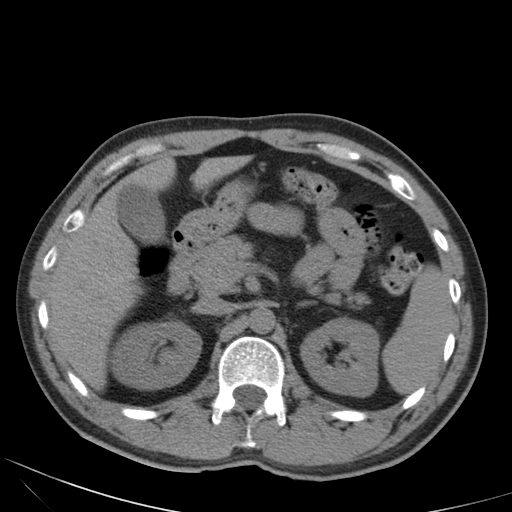
[im 80/100  soft-tissue]
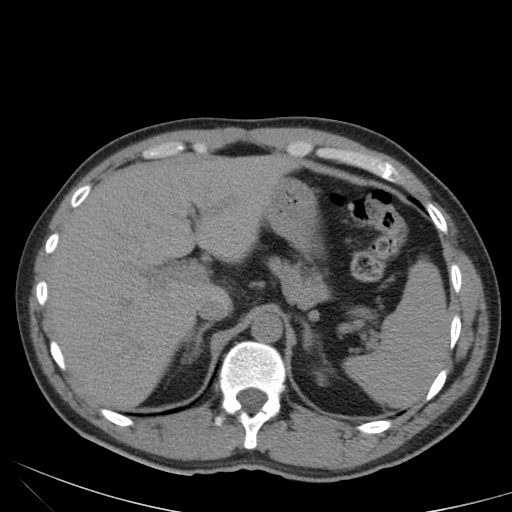
[im 88/100  soft-tissue]
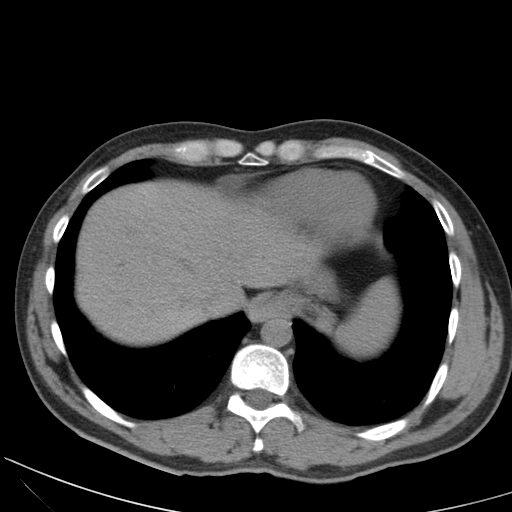
[im 96/100  soft-tissue]
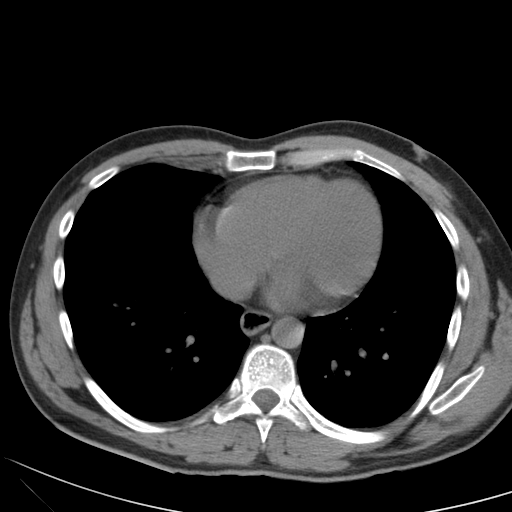

[coronal · coronal · 0.96mm/px · 3 of 111 slices shown]
[im 37/111  soft-tissue]
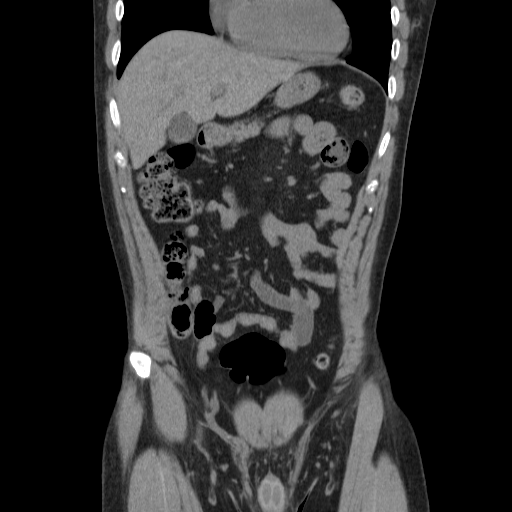
[im 49/111  soft-tissue]
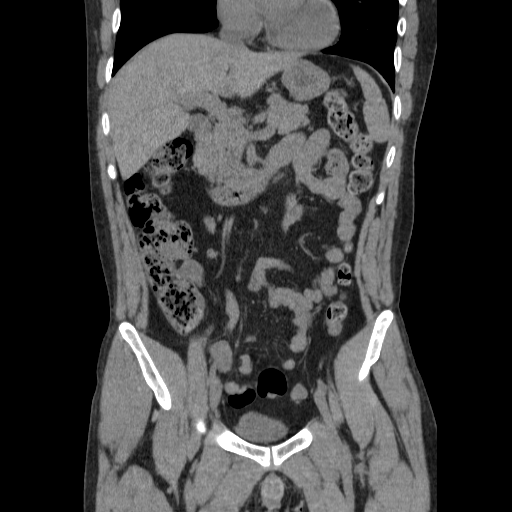
[im 62/111  soft-tissue]
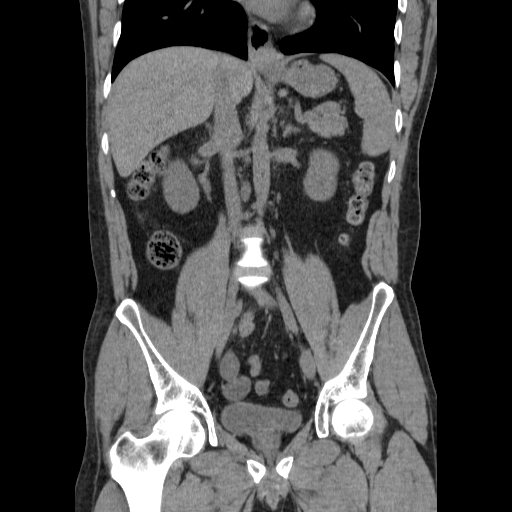

[17 of 46 positions shown; findings below may reference images not displayed]

FINDINGS: Minimal dependent atelectasis posteriorly in the visualized left
lung base. Unremarkable liver, gallbladder, spleen, adrenal glands,
left kidney, pancreas, aorta, stomach, small bowel, colon.
Unenhanced CT was performed per clinician order. Lack of IV contrast
limits sensitivity and specificity, especially for evaluation of
abdominal/pelvic solid viscera.

There is mild central distention of the right renal collecting
system and mild right ureterectasis down to the level of a 2mm
calculus, just proximal to the ureteral orifice. The urinary bladder
is nondistended. No nephrolithiasis.

No ascites. No free air. No adenopathy localized. Disk protrusions
L4-5 and L5-S1.
IMPRESSION: 1. Partially obstructing 2 mm distal right ureteral calculus.

## 2014-12-11 ENCOUNTER — Other Ambulatory Visit: Payer: Self-pay | Admitting: Internal Medicine

## 2015-03-15 ENCOUNTER — Encounter: Payer: Self-pay | Admitting: Internal Medicine

## 2015-03-15 ENCOUNTER — Ambulatory Visit (INDEPENDENT_AMBULATORY_CARE_PROVIDER_SITE_OTHER): Payer: PRIVATE HEALTH INSURANCE | Admitting: Internal Medicine

## 2015-03-15 VITALS — BP 132/84 | HR 82 | Temp 98.4°F | Wt 168.0 lb

## 2015-03-15 DIAGNOSIS — J309 Allergic rhinitis, unspecified: Secondary | ICD-10-CM | POA: Diagnosis not present

## 2015-03-15 DIAGNOSIS — F32A Depression, unspecified: Secondary | ICD-10-CM

## 2015-03-15 DIAGNOSIS — Z0189 Encounter for other specified special examinations: Secondary | ICD-10-CM

## 2015-03-15 DIAGNOSIS — G43009 Migraine without aura, not intractable, without status migrainosus: Secondary | ICD-10-CM

## 2015-03-15 DIAGNOSIS — K219 Gastro-esophageal reflux disease without esophagitis: Secondary | ICD-10-CM

## 2015-03-15 DIAGNOSIS — F411 Generalized anxiety disorder: Secondary | ICD-10-CM

## 2015-03-15 DIAGNOSIS — Z0000 Encounter for general adult medical examination without abnormal findings: Secondary | ICD-10-CM

## 2015-03-15 DIAGNOSIS — F329 Major depressive disorder, single episode, unspecified: Secondary | ICD-10-CM | POA: Diagnosis not present

## 2015-03-15 MED ORDER — FLUOXETINE HCL 40 MG PO CAPS: 40.0000 mg | ORAL_CAPSULE | Freq: Every day | ORAL | Status: DC

## 2015-03-15 MED ORDER — CLONAZEPAM 0.5 MG PO TABS: 0.5000 mg | ORAL_TABLET | Freq: Two times a day (BID) | ORAL | Status: DC | PRN

## 2015-03-15 MED ORDER — OMEPRAZOLE 20 MG PO CPDR: 20.0000 mg | DELAYED_RELEASE_CAPSULE | Freq: Every day | ORAL | Status: DC

## 2015-03-15 MED ORDER — METHYLPREDNISOLONE ACETATE 80 MG/ML IJ SUSP
80.0000 mg | Freq: Once | INTRAMUSCULAR | Status: AC
  Administered 2015-03-15: 80 mg via INTRAMUSCULAR

## 2015-03-15 MED ORDER — TRIAMCINOLONE ACETONIDE 55 MCG/ACT NA AERO: 2.0000 | INHALATION_SPRAY | Freq: Every day | NASAL | Status: DC

## 2015-03-15 MED ORDER — CETIRIZINE HCL 10 MG PO TABS: 10.0000 mg | ORAL_TABLET | Freq: Every day | ORAL | Status: DC

## 2015-03-15 MED ORDER — SUMATRIPTAN SUCCINATE 100 MG PO TABS: 100.0000 mg | ORAL_TABLET | ORAL | Status: DC | PRN

## 2015-03-15 NOTE — Patient Instructions (Addendum)
You had the steroid shot today  Please take all new medication as prescribed - the zyrtec and nasacort  Please continue all other medications as before, and refills have been done if requested.  Please have the pharmacy call with any other refills you may need.  Please keep your appointments with your specialists as you may have planned  Please return in 1 year for your yearly visit, or sooner if needed, with Lab testing done 3-5 days before

## 2015-03-15 NOTE — Progress Notes (Signed)
   Subjective:    Patient ID: Carolyne LittlesJames L Boberg Jr., male    DOB: 07/27/1986, 29 y.o.   MRN: [id number]  HPI Here to f/u, Does have several wks ongoing nasal allergy symptoms with clearish congestion, itch and sneezing, without fever, pain, ST, cough, swelling or wheezing.  Denies worsening reflux, abd pain, dysphagia, n/v, bowel change or blood, except with some breakthrough reflux recent with running out of PPI.  Denies worsening depressive symptoms, suicidal ideation, or panic; has ongoing anxiety, needs med re-start.  Has recurring migraine several days of each month no change, asks for imitrex refill, works well. Past Medical History  Diagnosis Date  . ANXIETY 10/14/2007    Qualifier: Diagnosis of  By: Jonny RuizJohn MD, Len BlalockJames W   . COMMON MIGRAINE 10/14/2007    Qualifier: Diagnosis of  By: Jonny RuizJohn MD, Len BlalockJames W   . DEPRESSION 10/14/2007    Qualifier: Diagnosis of  By: Jonny RuizJohn MD, Len BlalockJames W   . GERD 10/14/2007    Qualifier: Diagnosis of  By: Jonny RuizJohn MD, Len BlalockJames W   . Allergic rhinitis, cause unspecified 03/10/2013  . Lumbar disc disease 10/14/2013   Past Surgical History  Procedure Laterality Date  . Hand surgery    . Foot surgury      reports that he has never smoked. He has never used smokeless tobacco. He reports that he drinks alcohol. He reports that he does not use illicit drugs. family history includes Depression in his father; Hypertension in his mother; Migraines in his mother. Allergies  Allergen Reactions  . Cefaclor Other (See Comments)    Unknown (childhood)   Current Outpatient Prescriptions on File Prior to Visit  Medication Sig Dispense Refill  . ondansetron (ZOFRAN ODT) 4 MG disintegrating tablet Take 1 tablet (4 mg total) by mouth every 8 (eight) hours as needed for nausea or vomiting. (Patient not taking: Reported on 03/15/2015) 20 tablet 0   No current facility-administered medications on file prior to visit.    Review of Systems  Constitutional: Negative for unusual diaphoresis or night  sweats HENT: Negative for ringing in ear or discharge Eyes: Negative for double vision or worsening visual disturbance.  Respiratory: Negative for choking and stridor.   Gastrointestinal: Negative for vomiting or other signifcant bowel change Genitourinary: Negative for hematuria or change in urine volume.  Musculoskeletal: Negative for other MSK pain or swelling Skin: Negative for color change and worsening wound.  Neurological: Negative for tremors and numbness other than noted  Psychiatric/Behavioral: Negative for decreased concentration or agitation other than above       Objective:   Physical Exam BP 132/84 mmHg  Pulse 82  Temp(Src) 98.4 F (36.9 C) (Oral)  Wt 168 lb (76.204 kg)  SpO2 98% VS noted,  Constitutional: Pt appears in no significant distress HENT: Head: NCAT.  Right Ear: External ear normal.  Left Ear: External ear normal.  Eyes: . Pupils are equal, round, and reactive to light. Conjunctivae and EOM are normal Bilat tm's with mild erythema.  Max sinus areas non tender.  Pharynx with mild erythema, no exudate Neck: Normal range of motion. Neck supple.  Cardiovascular: Normal rate and regular rhythm.   Pulmonary/Chest: Effort normal and breath sounds without rales or wheezing.  Neurological: Pt is alert. Not confused , motor grossly intact Skin: Skin is warm. No rash, no LE edema Psychiatric: Pt behavior is normal. No agitation. mild nervous    Assessment & Plan:

## 2015-03-15 NOTE — Assessment & Plan Note (Addendum)
Ok for SSRI re-start and klonopin prn,  to f/u any worsening symptoms or concerns

## 2015-03-15 NOTE — Progress Notes (Signed)
Pre visit review using our clinic review tool, if applicable. No additional management support is needed unless otherwise documented below in the visit note. 

## 2015-03-15 NOTE — Assessment & Plan Note (Signed)
Ok for SSRI re-start,  to f/u any worsening symptoms or concerns

## 2015-03-15 NOTE — Assessment & Plan Note (Signed)
Overall stable, for imitrex refill,  to f/u any worsening symptoms or concerns

## 2015-03-15 NOTE — Assessment & Plan Note (Signed)
Mild to mod, for PPI restart,  to f/u any worsening symptoms or concerns 

## 2015-03-15 NOTE — Assessment & Plan Note (Signed)
Mild to mod, for depomedrol IM, zyrtec/nasacort asd,,  to f/u any worsening symptoms or concerns

## 2015-12-05 IMAGING — DX DG ANKLE COMPLETE 3+V*R*
3 series · 3 of 3 positions shown · non-contrast
Comparison: None.

CLINICAL DATA: Right ankle pain and swelling for 2 weeks, no trauma

EXAM:
RIGHT ANKLE - COMPLETE 3+ VIEW

[ankle ap]
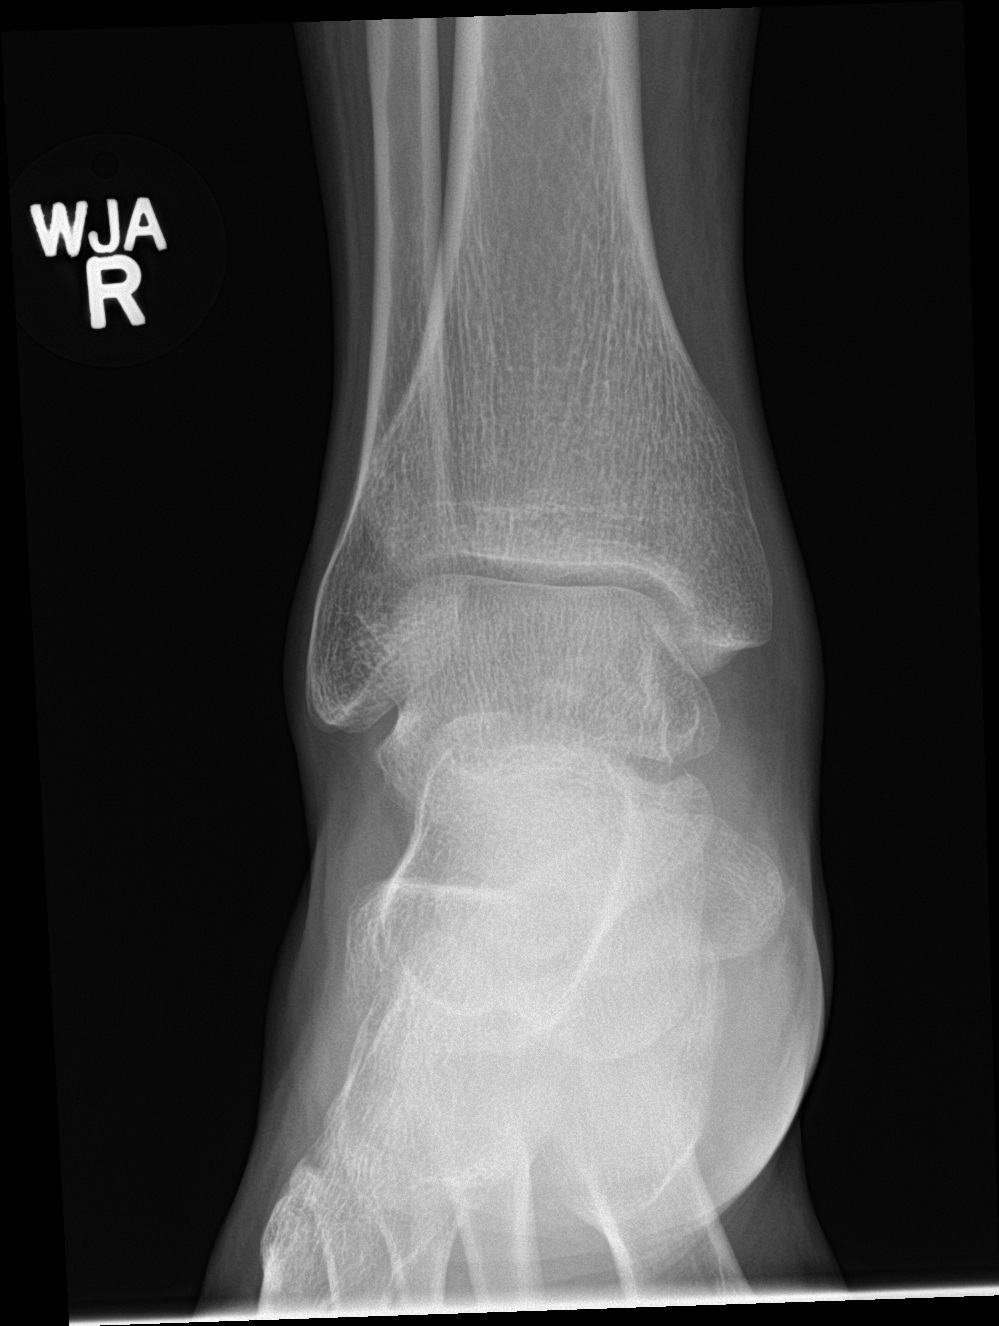

[ankle obl]
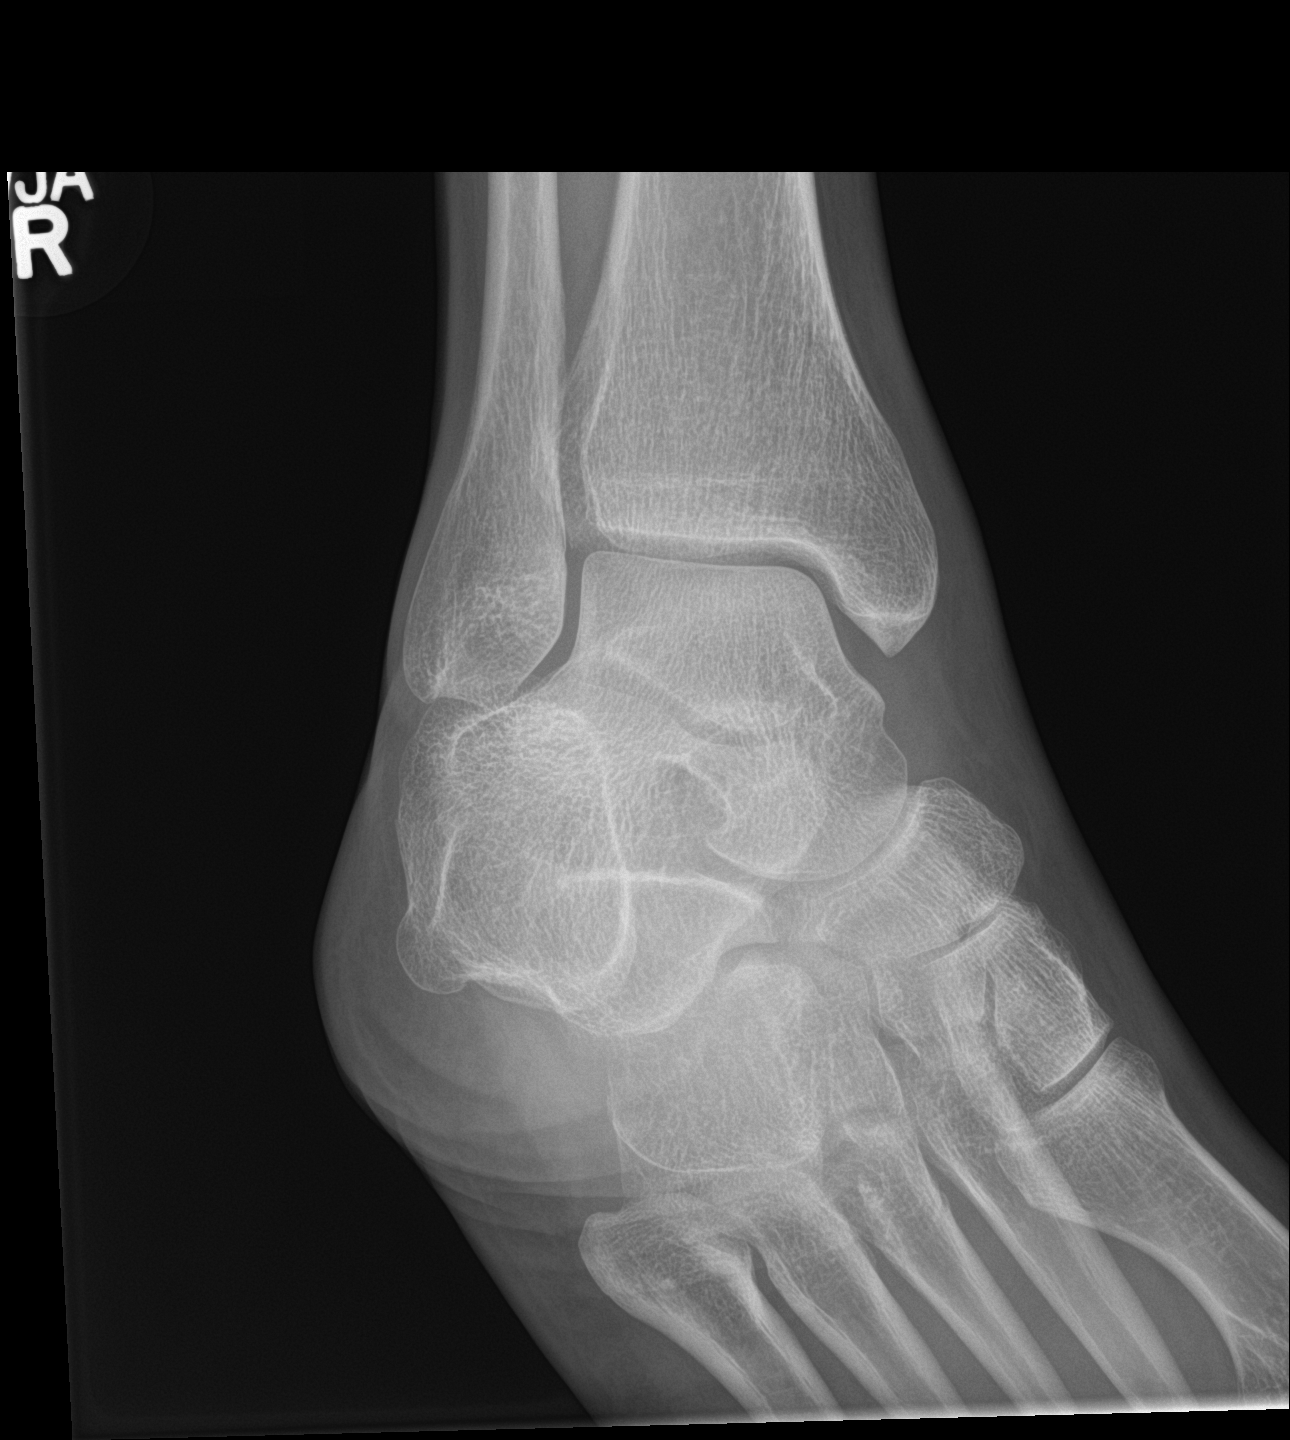

[ankle lat]
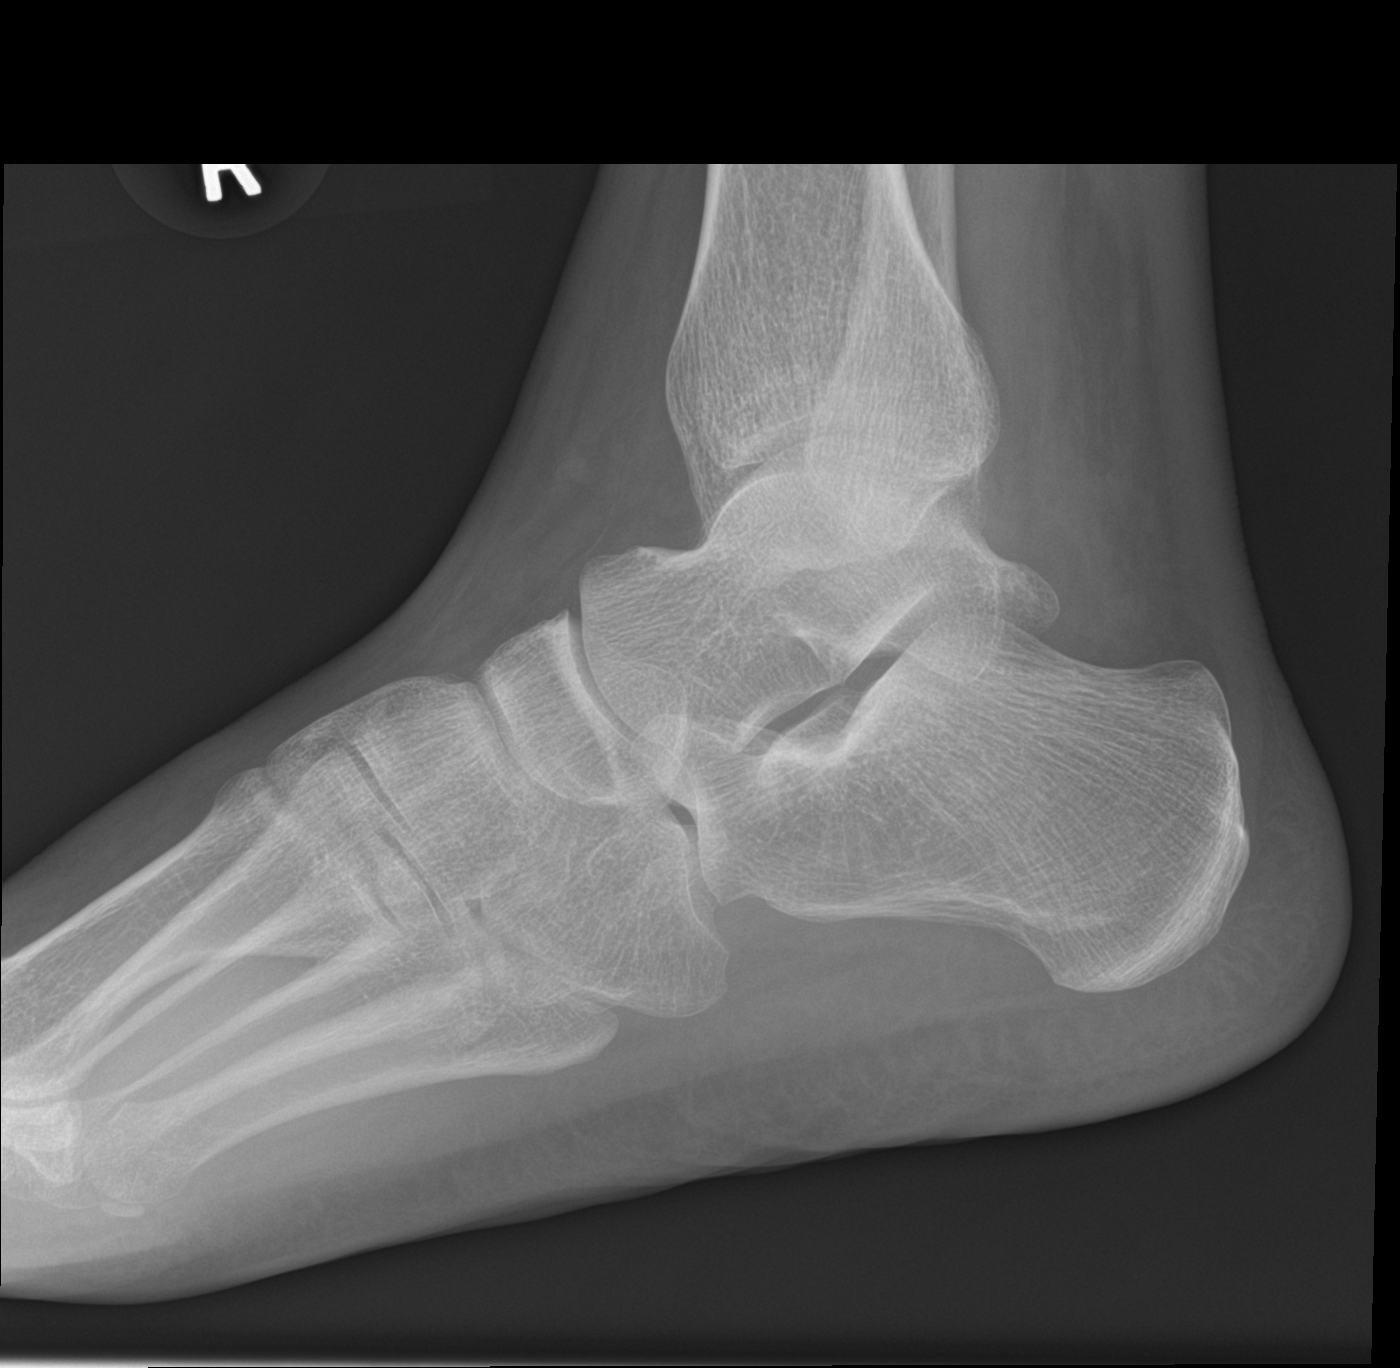

[3 of 3 positions shown; findings below may reference images not displayed]

FINDINGS: There is no evidence of fracture, dislocation, or joint effusion.
There is no evidence of arthropathy or other focal bone abnormality.
Soft tissues are unremarkable.
IMPRESSION: Negative.

## 2017-02-02 ENCOUNTER — Emergency Department
Admission: EM | Admit: 2017-02-02 | Discharge: 2017-02-02 | Disposition: A | Discharge status: Home / Self Care | Payer: BLUE CROSS/BLUE SHIELD | Attending: Emergency Medicine | Admitting: Emergency Medicine

## 2017-02-02 ENCOUNTER — Encounter: Payer: Self-pay | Admitting: Emergency Medicine

## 2017-02-02 ENCOUNTER — Emergency Department: Payer: BLUE CROSS/BLUE SHIELD

## 2017-02-02 DIAGNOSIS — Z79899 Other long term (current) drug therapy: Secondary | ICD-10-CM | POA: Diagnosis not present

## 2017-02-02 DIAGNOSIS — M5441 Lumbago with sciatica, right side: Secondary | ICD-10-CM | POA: Diagnosis not present

## 2017-02-02 DIAGNOSIS — M545 Low back pain: Secondary | ICD-10-CM | POA: Diagnosis present

## 2017-02-02 MED ORDER — HYDROCODONE-ACETAMINOPHEN 5-325 MG PO TABS: 1.0000 | ORAL_TABLET | ORAL | 0 refills | Status: DC | PRN

## 2017-02-02 MED ORDER — MELOXICAM 15 MG PO TABS: 15.0000 mg | ORAL_TABLET | Freq: Every day | ORAL | 0 refills | Status: DC

## 2017-02-02 MED ORDER — METHOCARBAMOL 500 MG PO TABS: 500.0000 mg | ORAL_TABLET | Freq: Four times a day (QID) | ORAL | 0 refills | Status: DC

## 2017-02-02 MED ORDER — KETOROLAC TROMETHAMINE 30 MG/ML IJ SOLN
30.0000 mg | Freq: Once | INTRAMUSCULAR | Status: AC
  Administered 2017-02-02: 30 mg via INTRAMUSCULAR
  Filled 2017-02-02: qty 1

## 2017-02-02 NOTE — ED Triage Notes (Signed)
Pt presents to ED with c/o lower back pain, pt states had back surgery in 2015. 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 Pt is ambulatory with no difficulty at this time.

## 2017-02-02 NOTE — ED Provider Notes (Signed)
[facility] Emergency Department Provider Note  ____________________________________________  Time seen: Approximately 3:23 PM  I have reviewed the triage vital signs and the nursing notes.   HISTORY  Chief Complaint Back Pain    HPI Ernest Mathews. is a 31 y.o. male who presents emergency department complaining of lower back pain with radicular symptoms down the right leg. Patient reports that he has a history of bulging disc to his lumbar spine that has been surgically corrected. Patient reports that he has intermittent lower back pain since surgery but over the past several days back pain has increased with radicular symptoms on the right leg. Patient reports that he does repetitive heavy lifting at work but denies any frank trauma. Patient reports that symptoms have been increasing for the past several days. He denies any bowel or bladder dysfunction, saddle anesthesia, paresthesias. He has tried heat and a family member's narcotic with some relief. Patient denies any urinary symptoms. No other complaint at this time.   Past Medical History:  Diagnosis Date  . Allergic rhinitis, cause unspecified 03/10/2013  . ANXIETY 10/14/2007   Qualifier: Diagnosis of  By: Jonny Ruiz MD, Len Blalock   . COMMON MIGRAINE 10/14/2007   Qualifier: Diagnosis of  By: Jonny Ruiz MD, Len Blalock   . DEPRESSION 10/14/2007   Qualifier: Diagnosis of  By: Jonny Ruiz MD, Len Blalock   . GERD 10/14/2007   Qualifier: Diagnosis of  By: Jonny Ruiz MD, Len Blalock   . Lumbar disc disease 10/14/2013    Patient Active Problem List   Diagnosis Date Noted  . Preventative health care 10/14/2013  . Lumbar disc disease 10/14/2013  . Right ureteral stone 10/14/2013  . Low back pain 08/16/2013  . Allergic rhinitis 03/10/2013  . Erectile dysfunction 07/21/2012  . Anxiety state 10/14/2007  . Depression 10/14/2007  . Migraine without aura 10/14/2007  . GERD 10/14/2007    Past Surgical History:  Procedure Laterality Date  . BACK  SURGERY    . foot surgury    . HAND SURGERY      Prior to Admission medications   Medication Sig Start Date End Date Taking? Authorizing Provider  cetirizine (ZYRTEC) 10 MG tablet Take 1 tablet (10 mg total) by mouth daily. 03/15/15   Corwin Levins, MD  clonazePAM (KLONOPIN) 0.5 MG tablet Take 1 tablet (0.5 mg total) by mouth 2 (two) times daily as needed. for anxiety 03/15/15   Corwin Levins, MD  FLUoxetine (PROZAC) 40 MG capsule Take 1 capsule (40 mg total) by mouth daily. 03/15/15   Corwin Levins, MD  HYDROcodone-acetaminophen (NORCO/VICODIN) 5-325 MG tablet Take 1 tablet by mouth every 4 (four) hours as needed for moderate pain. 02/02/17   Delorise Royals Emery Dupuy, PA-C  meloxicam (MOBIC) 15 MG tablet Take 1 tablet (15 mg total) by mouth daily. 02/02/17   Delorise Royals Rianne Degraaf, PA-C  methocarbamol (ROBAXIN) 500 MG tablet Take 1 tablet (500 mg total) by mouth 4 (four) times daily. 02/02/17   Delorise Royals Heriberto Stmartin, PA-C  omeprazole (PRILOSEC) 20 MG capsule Take 1 capsule (20 mg total) by mouth daily. 03/15/15   Corwin Levins, MD  ondansetron (ZOFRAN ODT) 4 MG disintegrating tablet Take 1 tablet (4 mg total) by mouth every 8 (eight) hours as needed for nausea or vomiting. Patient not taking: Reported on 03/15/2015 09/11/13   Elson Areas, PA-C  SUMAtriptan (IMITREX) 100 MG tablet Take 1 tablet (100 mg total) by mouth every 2 (two) hours as needed for migraine.  May repeat in 2 hours if headache persists or recurs. 03/15/15   Corwin Levins, MD  triamcinolone (NASACORT AQ) 55 MCG/ACT AERO nasal inhaler Place 2 sprays into the nose daily. 03/15/15   Corwin Levins, MD    Allergies Cefaclor  Family History  Problem Relation Age of Onset  . Hypertension Mother   . Migraines Mother   . Depression Father     Social History Social History  Substance Use Topics  . Smoking status: Never Smoker  . Smokeless tobacco: Never Used  . Alcohol use Yes     Review of Systems  Constitutional: No fever/chills Eyes: No visual  changes.  Cardiovascular: no chest pain. Respiratory: no cough. No SOB. Gastrointestinal: No abdominal pain.  No nausea, no vomiting.  No diarrhea.  No constipation. Genitourinary: Negative for dysuria. No hematuria Musculoskeletal: Positive for lower back pain. Skin: Negative for rash, abrasions, lacerations, ecchymosis. Neurological: Negative for headaches, focal weakness or numbness. 10-point ROS otherwise negative.  ____________________________________________   PHYSICAL EXAM:  VITAL SIGNS: ED Triage Vitals  Enc Vitals Group     BP 02/02/17 1435 124/77     Pulse Rate 02/02/17 1435 (!) 57     Resp 02/02/17 1435 18     Temp 02/02/17 1435 98 F (36.7 C)     Temp Source 02/02/17 1435 Oral     SpO2 02/02/17 1435 100 %     Weight 02/02/17 1436 170 lb (77.1 kg)     Height 02/02/17 1436  (1.778 m)     Head Circumference --      Peak Flow --      Pain Score 02/02/17 1436 8     Pain Loc --      Pain Edu? --      Excl. in [place]? --      Constitutional: Alert and oriented. Well appearing and in no acute distress. Eyes: Conjunctivae are normal. PERRL. EOMI. Head: Atraumatic. ENT:      Ears:       Nose: No congestion/rhinnorhea.      Mouth/Throat: Mucous membranes are moist.  Neck: No stridor.  No cervical spine tenderness to palpation.  Cardiovascular: Normal rate, regular rhythm. Normal S1 and S2.  Good peripheral circulation. Respiratory: Normal respiratory effort without tachypnea or retractions. Lungs CTAB. Good air entry to the bases with no decreased or absent breath sounds. Gastrointestinal: Bowel sounds 4 quadrants. Soft and nontender to palpation. No guarding or rigidity. No palpable masses. No distention. No CVA tenderness. Musculoskeletal: Full range of motion to all extremities. No gross deformities appreciated. No visible deformities displayed upon inspection. Patient is diffusely tender to palpation midline lumbar spine. No specific point tenderness and no  palpable abnormality or step-off. Tender palpation right-sided sciatic notch. Positive straight leg raise on the right side. Dorsalis pedis pulse intact bilateral lower extremities. Sensation intact all dermatomal distributions bilateral lower extremities. Neurologic:  Normal speech and language. No gross focal neurologic deficits are appreciated.  Skin:  Skin is warm, dry and intact. No rash noted. Psychiatric: Mood and affect are normal. Speech and behavior are normal. Patient exhibits appropriate insight and judgement.   ____________________________________________   LABS (all labs ordered are listed, but only abnormal results are displayed)  Labs Reviewed - No data to display ____________________________________________  EKG   ____________________________________________  RADIOLOGY Festus Barren Chela Sutphen, personally viewed and evaluated these images (plain radiographs) as part of my medical decision making, as well as reviewing the written report by the  radiologist.  Dg Lumbar Spine Complete  Result Date: 02/02/2017 CLINICAL DATA:  Lumbago for 4 days with lower extremity radicular symptoms EXAM: LUMBAR SPINE - COMPLETE 4+ VIEW COMPARISON:  Lumbar MRI December 15, 2013 FINDINGS: Frontal, lateral, spot lumbosacral lateral, and bilateral oblique views were obtained. There are 5 non-rib-bearing lumbar type vertebral bodies. There is no fracture or spondylolisthesis. Moderate disc space narrowing is noted at L4-5 and L5-S1, also present on prior MR. Other disc spaces appear unremarkable. There is no appreciable facet arthropathy. IMPRESSION: Stable disc space narrowing at L4-5 and L5-S1 compared to prior MR examination. Other disc spaces appear unremarkable. No evident fracture or spondylolisthesis. Electronically Signed   By: Bretta Bang III M.D.   On: 02/02/2017 15:57    ____________________________________________    PROCEDURES  Procedure(s) performed:     Procedures    Medications  ketorolac (TORADOL) 30 MG/ML injection 30 mg (not administered)     ____________________________________________   INITIAL IMPRESSION / ASSESSMENT AND PLAN / ED COURSE  Pertinent labs & imaging results that were available during my care of the patient were reviewed by me and considered in my medical decision making (see chart for details).  Review of the [place] CSRS was performed in accordance of the [facility] prior to dispensing any controlled drugs.     Patient's diagnosis is consistent with acute lower back pain with radicular symptoms down the right leg. Patient has a history of bulging disc that has been surgically corrected. Patient does routine heavy lifting and reports increased back pain over the past several days. X-ray reveals no changes in disc space compared to previous imaging. At this time, no concerning symptoms warranting further imaging or labs. Patient is given injection of Toradol emergency department.. Patient will be discharged home with prescriptions for anti-inflammatory, muscle relaxer, limited pain medication. Patient is to follow up with primary care or neurosurgeon as needed or otherwise directed. Patient is given ED precautions to return to the ED for any worsening or new symptoms.     ____________________________________________  FINAL CLINICAL IMPRESSION(S) / ED DIAGNOSES  Final diagnoses:  Acute midline low back pain with right-sided sciatica      NEW MEDICATIONS STARTED DURING THIS VISIT:  New Prescriptions   HYDROCODONE-ACETAMINOPHEN (NORCO/VICODIN) 5-325 MG TABLET    Take 1 tablet by mouth every 4 (four) hours as needed for moderate pain.   MELOXICAM (MOBIC) 15 MG TABLET    Take 1 tablet (15 mg total) by mouth daily.   METHOCARBAMOL (ROBAXIN) 500 MG TABLET    Take 1 tablet (500 mg total) by mouth 4 (four) times daily.        This chart was dictated using voice recognition software/Dragon. Despite best efforts  to proofread, errors can occur which can change the meaning. Any change was purely unintentional.    Racheal Patches, PA-C 02/02/17 1622    Minna Antis, MD 02/02/17 2252

## 2017-07-15 ENCOUNTER — Other Ambulatory Visit: Payer: Self-pay | Admitting: Rehabilitation

## 2017-07-15 DIAGNOSIS — M961 Postlaminectomy syndrome, not elsewhere classified: Secondary | ICD-10-CM

## 2017-07-25 ENCOUNTER — Other Ambulatory Visit: Payer: PRIVATE HEALTH INSURANCE

## 2017-07-25 ENCOUNTER — Ambulatory Visit
Admission: RE | Admit: 2017-07-25 | Discharge: 2017-07-25 | Disposition: A | Discharge status: Home / Self Care | Payer: PRIVATE HEALTH INSURANCE | Source: Ambulatory Visit | Attending: Rehabilitation | Admitting: Rehabilitation

## 2017-07-25 DIAGNOSIS — M961 Postlaminectomy syndrome, not elsewhere classified: Secondary | ICD-10-CM

## 2017-07-25 MED ORDER — GADOBENATE DIMEGLUMINE 529 MG/ML IV SOLN
17.0000 mL | Freq: Once | INTRAVENOUS | Status: AC | PRN
  Administered 2017-07-25: 17 mL via INTRAVENOUS

## 2018-10-25 ENCOUNTER — Encounter: Payer: Self-pay | Admitting: Emergency Medicine

## 2018-10-25 ENCOUNTER — Other Ambulatory Visit: Payer: Self-pay

## 2018-10-25 ENCOUNTER — Emergency Department
Admission: EM | Admit: 2018-10-25 | Discharge: 2018-10-25 | Disposition: A | Discharge status: Home / Self Care | Payer: BLUE CROSS/BLUE SHIELD | Attending: Emergency Medicine | Admitting: Emergency Medicine

## 2018-10-25 ENCOUNTER — Emergency Department: Payer: BLUE CROSS/BLUE SHIELD

## 2018-10-25 DIAGNOSIS — I82432 Acute embolism and thrombosis of left popliteal vein: Secondary | ICD-10-CM

## 2018-10-25 DIAGNOSIS — I82812 Embolism and thrombosis of superficial veins of left lower extremities: Secondary | ICD-10-CM

## 2018-10-25 DIAGNOSIS — Z79899 Other long term (current) drug therapy: Secondary | ICD-10-CM | POA: Insufficient documentation

## 2018-10-25 DIAGNOSIS — M79605 Pain in left leg: Secondary | ICD-10-CM | POA: Diagnosis present

## 2018-10-25 LAB — CBC
HCT: 41.3 % (ref 39.0–52.0)
Hemoglobin: 14.2 g/dL (ref 13.0–17.0)
MCH: 30.5 pg (ref 26.0–34.0)
MCHC: 34.4 g/dL (ref 30.0–36.0)
MCV: 88.6 fL (ref 80.0–100.0)
Platelets: 171 10*3/uL (ref 150–400)
RBC: 4.66 MIL/uL (ref 4.22–5.81)
RDW: 11.7 % (ref 11.5–15.5)
WBC: 6.8 10*3/uL (ref 4.0–10.5)
nRBC: 0 % (ref 0.0–0.2)

## 2018-10-25 LAB — BASIC METABOLIC PANEL
ANION GAP: 4 — AB (ref 5–15)
BUN: 14 mg/dL (ref 6–20)
CALCIUM: 8.7 mg/dL — AB (ref 8.9–10.3)
CO2: 26 mmol/L (ref 22–32)
CREATININE: 0.91 mg/dL (ref 0.61–1.24)
Chloride: 108 mmol/L (ref 98–111)
Glucose, Bld: 89 mg/dL (ref 70–99)
Potassium: 4 mmol/L (ref 3.5–5.1)
SODIUM: 138 mmol/L (ref 135–145)

## 2018-10-25 LAB — APTT: APTT: 28 s (ref 24–36)

## 2018-10-25 MED ORDER — RIVAROXABAN (XARELTO) EDUCATION KIT FOR DVT/PE PATIENTS
PACK | Freq: Once | Status: AC
  Administered 2018-10-25: 17:00:00
  Filled 2018-10-25: qty 1

## 2018-10-25 MED ORDER — RIVAROXABAN 15 MG PO TABS
15.0000 mg | ORAL_TABLET | Freq: Once | ORAL | Status: AC
  Administered 2018-10-25: 15 mg via ORAL
  Filled 2018-10-25: qty 1

## 2018-10-25 MED ORDER — RIVAROXABAN (XARELTO) VTE STARTER PACK (15 & 20 MG): ORAL_TABLET | ORAL | 0 refills | Status: DC

## 2018-10-25 NOTE — Discharge Instructions (Addendum)
You have been found to have a DVT (bloot clot) in the left leg. You will be started in a blood thinning medicine to treat current and prevent further blood clots. You should follow-up with your provider or the vascular specialist before the 30-day supply is completed. Return to the ED for any concerning symptoms.

## 2018-10-25 NOTE — ED Provider Notes (Signed)
[facility] Emergency Department Provider Note ____________________________________________  Time seen: 1425  I have reviewed the triage vital signs and the nursing notes.  HISTORY  Chief Complaint  Leg Pain and Ankle Pain  HPI Ernest Mathews. is a 33 y.o. male presents to the ED for evaluation of intermittent left lower extremity pain, swelling, disability.  Patient with a history of L5-S1 HNP and sciatica, presents with a 2 to 3-day complaint of increased left leg pain and weakness.  He describes Friday after completing his workday, that he noted some swelling to the lower leg.  He has a picture on his cell phone which he believes demonstrates a left greater than right leg circumference.  Patient is also noted some focal swelling and edema to the medial left ankle.  He denies any known injury, accident, or trauma.  He is also denies any bladder or bowel incontinence, foot drop, or saddle anesthesias.  He continues to note posterior leg paresthesias consistent with his sciatic nerve irritation.  Past Medical History:  Diagnosis Date  . Allergic rhinitis, cause unspecified 03/10/2013  . ANXIETY 10/14/2007   Qualifier: Diagnosis of  By: Jenny Reichmann MD, [place] 10/14/2007   Qualifier: Diagnosis of  By: Jenny Reichmann MD, Hunt Oris   . DEPRESSION 10/14/2007   Qualifier: Diagnosis of  By: Jenny Reichmann MD, Hunt Oris   . GERD 10/14/2007   Qualifier: Diagnosis of  By: Jenny Reichmann MD, Hunt Oris   . Lumbar disc disease 10/14/2013    Patient Active Problem List   Diagnosis Date Noted  . Preventative health care 10/14/2013  . Lumbar disc disease 10/14/2013  . Right ureteral stone 10/14/2013  . Low back pain 08/16/2013  . Allergic rhinitis 03/10/2013  . Erectile dysfunction 07/21/2012  . Anxiety state 10/14/2007  . Depression 10/14/2007  . Migraine without aura 10/14/2007  . GERD 10/14/2007    Past Surgical History:  Procedure Laterality Date  . BACK SURGERY    . foot surgury    . HAND  SURGERY      Prior to Admission medications   Medication Sig Start Date End Date Taking? Authorizing Provider  cetirizine (ZYRTEC) 10 MG tablet Take 1 tablet (10 mg total) by mouth daily. 03/15/15   Biagio Borg, MD  clonazePAM (KLONOPIN) 0.5 MG tablet Take 1 tablet (0.5 mg total) by mouth 2 (two) times daily as needed. for anxiety 03/15/15   Biagio Borg, MD  FLUoxetine (PROZAC) 40 MG capsule Take 1 capsule (40 mg total) by mouth daily. 03/15/15   Biagio Borg, MD  omeprazole (PRILOSEC) 20 MG capsule Take 1 capsule (20 mg total) by mouth daily. 03/15/15   Biagio Borg, MD  Rivaroxaban 15 & 20 MG TBPK Take as directed on package: Start with one 37m tablet by mouth twice a day with food. On Day 22, switch to one 256mtablet once a day with food. 10/25/18   Birda Didonato, JeDannielle KarvonenPA-C  SUMAtriptan (IMITREX) 100 MG tablet Take 1 tablet (100 mg total) by mouth every 2 (two) hours as needed for migraine. May repeat in 2 hours if headache persists or recurs. 03/15/15   JoBiagio BorgMD  triamcinolone (NASACORT AQ) 55 MCG/ACT AERO nasal inhaler Place 2 sprays into the nose daily. 03/15/15   JoBiagio BorgMD    Allergies Cefaclor  Family History  Problem Relation Age of Onset  . Hypertension Mother   . Migraines Mother   .  Depression Father     Social History Social History   Tobacco Use  . Smoking status: Never Smoker  . Smokeless tobacco: Never Used  Substance Use Topics  . Alcohol use: Yes  . Drug use: No    Review of Systems  Constitutional: Negative for fever. Cardiovascular: Negative for chest pain. Respiratory: Negative for shortness of breath. Gastrointestinal: Negative for abdominal pain, vomiting and diarrhea. Genitourinary: Negative for dysuria. Musculoskeletal: Negative for back pain. LLE swelling as noted Skin: Negative for rash. Neurological: Negative for headaches, focal weakness or numbness. left sciatic nerve irritation as noted.   ____________________________________________  PHYSICAL EXAM:  VITAL SIGNS: ED Triage Vitals  Enc Vitals Group     BP 10/25/18 1313 119/63     Pulse Rate 10/25/18 1313 (!) 55     Resp 10/25/18 1313 18     Temp 10/25/18 1313 98.2 F (36.8 C)     Temp Source 10/25/18 1313 Oral     SpO2 10/25/18 1313 100 %     Weight 10/25/18 1312 186 lb (84.4 kg)     Height 10/25/18 1312 _0  (1.778 m)     Head Circumference --      Peak Flow --      Pain Score 10/25/18 1312 8     Pain Loc --      Pain Edu? --      Excl. in [place]? --     Constitutional: Alert and oriented. Well appearing and in no distress. Head: Normocephalic and atraumatic. Eyes: Conjunctivae are normal. Normal extraocular movements Cardiovascular: Normal rate, regular rhythm. Normal distal pulses and cap refill. No pitting edema noted distally.  Respiratory: Normal respiratory effort. No wheezes/rales/rhonchi. Gastrointestinal: Soft and nontender. No distention. Musculoskeletal: Left lower extremity without any obvious deformity, dislocation, or edema.  Patient with some soft tissue swelling and joint effusion noted to the medial ankle.  Ankle ranges full and active.  Knee is benign without any signs of internal derangement.  Patient with active range of motion with hip flexion internal/external rotation.  Nontender with normal range of motion in all extremities.  Neurologic:  Normal gross sensation. Normal LE DTRs bilaterally. Normal speech and language. No gross focal neurologic deficits are appreciated. Skin:  Skin is warm, dry and intact. No rash noted. ____________________________________________   RADIOLOGY  LLE US Venous Imaging  IMPRESSION: Nonocclusive deep venous thrombosis within the popliteal vein.  Superficial thrombus within the lesser saphenous vein. ______________________________________________  LABORATORY  Labs Reviewed  BASIC METABOLIC PANEL - Abnormal; Notable for the following components:       Result Value   Calcium 8.7 (*)    Anion gap 4 (*)    All other components within normal limits  CBC  APTT  ____________________________________________  PROCEDURES  Procedures Rivaroxaban 15 mg PO Rivaroxaban Education Kit ____________________________________________  INITIAL IMPRESSION / ASSESSMENT AND PLAN / ED COURSE  Patient with ED evaluation of a 2 to 3-day complaint of left lower extremity edema and ankle swelling.  Patient with a history of L5-S1 HNP, presents with radicular symptoms at a familiar as well as some new complaints of leg swelling and cramping at the calf.  He was found to have a nonocclusive DVT of the popliteal vein and a superficial thrombus of the lesser saphenous on the left lower extremity.  Patient will be started rivaroxaban for DVT management.  His questions were elicited and answered.  He is referred to vascular and vein, for further management of his symptoms.  Return  precautions have been reviewed. ____________________________________________  FINAL CLINICAL IMPRESSION(S) / ED DIAGNOSES  Final diagnoses:  Acute deep vein thrombosis (DVT) of popliteal vein of left lower extremity (HCC)  Thrombosis of saphenous vein, left      Areesha Dehaven, Dannielle Karvonen, PA-C 10/25/18 1805    Nance Pear, MD 10/25/18 [phone]

## 2018-10-25 NOTE — ED Triage Notes (Signed)
First RN: pt here for leg and ankle pain. Initially refused wheelchair but accepted after being asked multiple times. Ambulatory with steady gait prior to wheelchair.

## 2018-10-25 NOTE — ED Notes (Signed)
Pt transportable to US at this time

## 2018-10-25 NOTE — ED Triage Notes (Signed)
Pt arrived via POV with reports of left leg pain that started on Friday, denies injury c/o pain down entire leg to ankle and ankle swelling.  Pt states he has hx of back surgery in 2015

## 2018-10-28 ENCOUNTER — Ambulatory Visit (INDEPENDENT_AMBULATORY_CARE_PROVIDER_SITE_OTHER): Payer: BLUE CROSS/BLUE SHIELD | Admitting: Nurse Practitioner

## 2018-10-28 ENCOUNTER — Encounter (INDEPENDENT_AMBULATORY_CARE_PROVIDER_SITE_OTHER): Payer: Self-pay | Admitting: Nurse Practitioner

## 2018-10-28 VITALS — BP 125/76 | HR 64 | Resp 16 | Ht 70.0 in | Wt 183.4 lb

## 2018-10-28 DIAGNOSIS — I82432 Acute embolism and thrombosis of left popliteal vein: Secondary | ICD-10-CM

## 2018-10-28 DIAGNOSIS — I82439 Acute embolism and thrombosis of unspecified popliteal vein: Secondary | ICD-10-CM | POA: Insufficient documentation

## 2018-10-28 DIAGNOSIS — M519 Unspecified thoracic, thoracolumbar and lumbosacral intervertebral disc disorder: Secondary | ICD-10-CM

## 2018-10-28 DIAGNOSIS — K219 Gastro-esophageal reflux disease without esophagitis: Secondary | ICD-10-CM

## 2018-10-28 MED ORDER — TRAMADOL HCL 50 MG PO TABS: 100.0000 mg | ORAL_TABLET | Freq: Four times a day (QID) | ORAL | 0 refills | Status: AC | PRN

## 2018-10-28 MED ORDER — RIVAROXABAN 20 MG PO TABS: 20.0000 mg | ORAL_TABLET | Freq: Every day | ORAL | 2 refills | Status: DC

## 2018-10-28 NOTE — Progress Notes (Signed)
Subjective:    Patient ID: Ernest LittlesJames L Mchaney Jr., male    DOB: 05/28/1986, 33 y.o.   MRN: [id number] Chief Complaint  Patient presents with  . New Patient (Initial Visit)    HPI  Ernest LittlesJames L Haberer Jr. is a 33 y.o. male that presents today after being diagnosed with a DVT of the popliteal artery of the left lower extremity.  He also had evidence of a superficial thrombophlebitis of the great saphenous vein, also the left lower extremity.  He denies any precipitating factors such as dehydration, trauma to lower extremities, long periods of immobility.  His mother is present with him at today's appointment, and she states that his father has a history of recurrent DVTs and he is also subsequently on lifelong anticoagulation.  They are unsure if he has a genetic hypercoagulable disorder as he has never been tested.  There is also no obvious precipitating factors with his father's DVTs.  The patient denies any chest pain or shortness of breath.  He denies any nausea, vomiting or diarrhea.  He endorses severe pain still of the left lower extremity.  He describes having difficulties with walking due to the pain.  He denies having any issues while on Xarelto currently.  Past Medical History:  Diagnosis Date  . Allergic rhinitis, cause unspecified 03/10/2013  . ANXIETY 10/14/2007   Qualifier: Diagnosis of  By: Jonny RuizJohn MD, Len BlalockJames W   . COMMON MIGRAINE 10/14/2007   Qualifier: Diagnosis of  By: Jonny RuizJohn MD, Len BlalockJames W   . DEPRESSION 10/14/2007   Qualifier: Diagnosis of  By: Jonny RuizJohn MD, Len BlalockJames W   . GERD 10/14/2007   Qualifier: Diagnosis of  By: Jonny RuizJohn MD, Len BlalockJames W   . Lumbar disc disease 10/14/2013    Past Surgical History:  Procedure Laterality Date  . BACK SURGERY    . foot surgury    . HAND SURGERY      Social History   Socioeconomic History  . Marital status: Married    Spouse name: Not on file  . Number of children: Not on file  . Years of education: Not on file  . Highest education level: Not on file    Occupational History  . Not on file  Social Needs  . Financial resource strain: Not on file  . Food insecurity:    Worry: Not on file    Inability: Not on file  . Transportation needs:    Medical: Not on file    Non-medical: Not on file  Tobacco Use  . Smoking status: Current Every Day Smoker    Types: E-cigarettes  . Smokeless tobacco: Never Used  . Tobacco comment: VAPE PEN  Substance and Sexual Activity  . Alcohol use: Yes  . Drug use: No  . Sexual activity: Not on file  Lifestyle  . Physical activity:    Days per week: Not on file    Minutes per session: Not on file  . Stress: Not on file  Relationships  . Social connections:    Talks on phone: Not on file    Gets together: Not on file    Attends religious service: Not on file    Active member of club or organization: Not on file    Attends meetings of clubs or organizations: Not on file    Relationship status: Not on file  . Intimate partner violence:    Fear of current or ex partner: Not on file    Emotionally abused: Not on file  Physically abused: Not on file    Forced sexual activity: Not on file  Other Topics Concern  . Not on file  Social History Narrative  . Not on file    Family History  Problem Relation Age of Onset  . Hypertension Mother   . Migraines Mother   . Depression Father     Allergies  Allergen Reactions  . Cefaclor Other (See Comments)    Unknown (childhood) Unknown (childhood)     Review of Systems   Review of Systems: Negative Unless Checked Constitutional: [] Weight loss  [] Fever  [] Chills Cardiac: [] Chest pain   []  Atrial Fibrillation  [] Palpitations   [] Shortness of breath when laying flat   [] Shortness of breath with exertion. [] Shortness of breath at rest Vascular:  [] Pain in legs with walking   [] Pain in legs with standing [] Pain in legs when laying flat   [] Claudication    [] Pain in feet when laying flat    [x] History of DVT   [] Phlebitis   [x] Swelling in legs    [] Varicose veins   [] Non-healing ulcers Pulmonary:   [] Uses home oxygen   [] Productive cough   [] Hemoptysis   [] Wheeze  [] COPD   [] Asthma Neurologic:  [] Dizziness   [] Seizures  [] Blackouts [] History of stroke   [] History of TIA  [] Aphasia   [] Temporary Blindness   [] Weakness or numbness in arm   [] Weakness or numbness in leg Musculoskeletal:   [] Joint swelling   [] Joint pain   [x] Low back pain  []  History of Knee Replacement [] Arthritis [x] back Surgeries  []  Spinal Stenosis    Hematologic:  [] Easy bruising  [] Easy bleeding   [] Hypercoagulable state   [] Anemic Gastrointestinal:  [] Diarrhea   [] Vomiting  [x] Gastroesophageal reflux/heartburn   [] Difficulty swallowing. [] Abdominal pain Genitourinary:  [] Chronic kidney disease   [] Difficult urination  [] Anuric   [] Blood in urine [] Frequent urination  [] Burning with urination   [] Hematuria Skin:  [] Rashes   [] Ulcers [] Wounds Psychological:  [x] History of anxiety   [x]  History of major depression  []  Memory Difficulties     Objective:   Physical Exam  BP 125/76 (BP Location: Right Arm, Patient Position: Sitting)   Pulse 64   Resp 16   Ht 5\' 10"  (1.778 m)   Wt 183 lb 6.4 oz (83.2 kg)   BMI 26.32 kg/m   Gen: WD/WN, NAD Head: [place]/AT, No temporalis wasting.  Ear/Nose/Throat: Hearing grossly intact, nares w/o erythema or drainage Eyes: PER, EOMI, sclera nonicteric.  Neck: Supple, no masses.  No JVD.  Pulmonary:  Good air movement, no use of accessory muscles.  Cardiac: RRR Vascular:  2+ edema of left lower extremity, with phlebitis near ankle area Vessel Right Left  Radial Palpable Palpable  Dorsalis Pedis Palpable Palpable  Posterior Tibial Palpable Palpable   Gastrointestinal: soft, non-distended. No guarding/no peritoneal signs.  Musculoskeletal: M/S 5/5 throughout.  No deformity or atrophy.  Neurologic: Pain and light touch intact in extremities.  Symmetrical.  Speech is fluent. Motor exam as listed above. Psychiatric: Judgment intact,  Mood & affect appropriate for pt's clinical situation. Dermatologic: No Venous rashes. No Ulcers Noted.  No changes consistent with cellulitis. Lymph : No Cervical lymphadenopathy, no lichenification or skin changes of chronic lymphedema.      Assessment & Plan:   1. Acute deep vein thrombosis (DVT) of popliteal vein of left lower extremity (HCC)  Due to the fact that the patient has a strong genetic component for DVT, I have placed an order for a hypercoagulable panel to  be done at the patient's convenience.  The patient is still experiencing extreme pain from the DVT, so I have given him a week supply of tramadol.  The patient will continue to take the Xarelto starter pack, I have given him a 90-day supply to take once he finishes that starter pack.  We will plan for at least 3 months of anticoagulation, possibly longer depending on the results of the test as well as the results of the lower extremity DVT study.    I have for the patient that if at his 51-month follow-up appointment he still shows evidence of DVT we will continue for 6 months from initial diagnosis, provided that his hypercoagulable panel results are negative.  If these results are positive, he may plan on indefinite term of anticoagulation.  Patient was also given a note to return to work on Monday, on light duty for the next 3 months.  Instructed patient about postphlebitic syndrome and how the left lower extremity will be more likely to swell in the future.  Advised the patient to start wearing medical grade 1 compression stockings in approximately 1 week.  Also advised that he should elevate his lower extremity as much as possible when not ambulating, to prevent further swelling and discomfort.  - Hypercoagulable panel, comprehensive; Future - traMADol (ULTRAM) 50 MG tablet; Take 2 tablets (100 mg total) by mouth every 6 (six) hours as needed for up to 5 days.  Dispense: 30 tablet; Refill: 0 - rivaroxaban (XARELTO) 20 MG  TABS tablet; Take 1 tablet (20 mg total) by mouth daily with supper.  Dispense: 30 tablet; Refill: 2 - VAS [place] LOWER EXTREMITY VENOUS (DVT); Future  2. Gastroesophageal reflux disease, esophagitis presence not specified Continue PPI as already ordered, this medication has been reviewed and there are no changes at this time.  Avoidence of caffeine and alcohol  Moderate elevation of the head of the bed   3. Lumbar disc disease Continue NSAID medications as already ordered, these medications have been reviewed and there are no changes at this time.  Continued activity and therapy was stressed.    Current Outpatient Medications on File Prior to Visit  Medication Sig Dispense Refill  . clonazePAM (KLONOPIN) 0.5 MG tablet Take 1 tablet (0.5 mg total) by mouth 2 (two) times daily as needed. for anxiety 60 tablet 2  . FLUoxetine (PROZAC) 40 MG capsule Take 1 capsule (40 mg total) by mouth daily. 90 capsule 3  . omeprazole (PRILOSEC) 20 MG capsule Take 1 capsule (20 mg total) by mouth daily. 90 capsule 3  . Rivaroxaban 15 & 20 MG TBPK Take as directed on package: Start with one 15mg  tablet by mouth twice a day with food. On Day 22, switch to one 20mg  tablet once a day with food. 51 each 0  . SUMAtriptan (IMITREX) 100 MG tablet Take 1 tablet (100 mg total) by mouth every 2 (two) hours as needed for migraine. May repeat in 2 hours if headache persists or recurs. 10 tablet 5   No current facility-administered medications on file prior to visit.     There are no Patient Instructions on file for this visit. No follow-ups on file.   Georgiana Spinner, NP  This note was completed with Office manager.  Any errors are purely unintentional.

## 2018-10-30 ENCOUNTER — Other Ambulatory Visit (INDEPENDENT_AMBULATORY_CARE_PROVIDER_SITE_OTHER): Payer: Self-pay | Admitting: Nurse Practitioner

## 2018-10-30 ENCOUNTER — Other Ambulatory Visit (HOSPITAL_COMMUNITY)
Admission: RE | Admit: 2018-10-30 | Discharge: 2018-10-30 | Disposition: A | Discharge status: Home / Self Care | Payer: BLUE CROSS/BLUE SHIELD | Source: Ambulatory Visit | Attending: Nurse Practitioner | Admitting: Nurse Practitioner

## 2018-11-05 ENCOUNTER — Other Ambulatory Visit (INDEPENDENT_AMBULATORY_CARE_PROVIDER_SITE_OTHER): Payer: Self-pay | Admitting: Nurse Practitioner

## 2018-11-05 ENCOUNTER — Telehealth (INDEPENDENT_AMBULATORY_CARE_PROVIDER_SITE_OTHER): Payer: Self-pay | Admitting: Nurse Practitioner

## 2018-11-05 DIAGNOSIS — I82432 Acute embolism and thrombosis of left popliteal vein: Secondary | ICD-10-CM

## 2018-11-05 NOTE — Telephone Encounter (Signed)
Can you put orders in the system for this patient. See note below.

## 2018-11-05 NOTE — Telephone Encounter (Signed)
I have placed the orders, as well as faxed them to the lab.  If you could let him know. thanks

## 2018-11-06 ENCOUNTER — Other Ambulatory Visit (HOSPITAL_COMMUNITY)
Admission: RE | Admit: 2018-11-06 | Discharge: 2018-11-06 | Disposition: A | Discharge status: Home / Self Care | Payer: BLUE CROSS/BLUE SHIELD | Source: Ambulatory Visit | Attending: Nurse Practitioner | Admitting: Nurse Practitioner

## 2018-11-06 NOTE — Telephone Encounter (Signed)
Spoke with the patient and let him know that per [facility] the order has been put in the system and faxed as well.

## 2018-11-16 LAB — MISC LABCORP TEST (SEND OUT): Labcorp test code: 503200

## 2019-01-28 ENCOUNTER — Encounter (INDEPENDENT_AMBULATORY_CARE_PROVIDER_SITE_OTHER): Payer: Self-pay | Admitting: Nurse Practitioner

## 2019-01-28 ENCOUNTER — Other Ambulatory Visit: Payer: Self-pay

## 2019-01-28 ENCOUNTER — Ambulatory Visit (INDEPENDENT_AMBULATORY_CARE_PROVIDER_SITE_OTHER): Payer: BLUE CROSS/BLUE SHIELD | Admitting: Nurse Practitioner

## 2019-01-28 ENCOUNTER — Ambulatory Visit (INDEPENDENT_AMBULATORY_CARE_PROVIDER_SITE_OTHER): Payer: BLUE CROSS/BLUE SHIELD

## 2019-01-28 VITALS — BP 125/77 | HR 59 | Resp 17 | Ht 70.0 in | Wt 195.8 lb

## 2019-01-28 DIAGNOSIS — F17210 Nicotine dependence, cigarettes, uncomplicated: Secondary | ICD-10-CM

## 2019-01-28 DIAGNOSIS — I82432 Acute embolism and thrombosis of left popliteal vein: Secondary | ICD-10-CM

## 2019-01-28 DIAGNOSIS — Z7901 Long term (current) use of anticoagulants: Secondary | ICD-10-CM | POA: Diagnosis not present

## 2019-01-28 DIAGNOSIS — Z79899 Other long term (current) drug therapy: Secondary | ICD-10-CM

## 2019-01-28 DIAGNOSIS — Z86718 Personal history of other venous thrombosis and embolism: Secondary | ICD-10-CM

## 2019-01-28 DIAGNOSIS — K219 Gastro-esophageal reflux disease without esophagitis: Secondary | ICD-10-CM

## 2019-01-28 NOTE — Progress Notes (Signed)
SUBJECTIVE:  Patient ID: Ernest Littles., male    DOB: [birth year], 33 y.o.   MRN: [id number] Chief Complaint  Patient presents with  . Follow-up    10month left le dvt ultrasound    HPI  Ernest Demott. is a 33 y.o. male that presents today for follow-up studies after having a DVT found in his left lower extremity in January 2020.  Today, the patient states that his leg has no more swelling and he has occasional pains in his leg however they are not significant.  He states that he can control his pain with ibuprofen and Tylenol.  He denies any issues with currently taking his Xarelto.  He denies any fevers, chills, nausea, vomiting or diarrhea.  He denies any chest pain or shortness of breath.  Noninvasive studies today show that the previous DVT found within his left popliteal vein has resolved at this time.  No evidence of thrombophlebitis was found as well.  No new DVT was discovered.  Past Medical History:  Diagnosis Date  . Allergic rhinitis, cause unspecified 03/10/2013  . ANXIETY 10/14/2007   Qualifier: Diagnosis of  By: Jonny Ruiz MD, Len Blalock   . COMMON MIGRAINE 10/14/2007   Qualifier: Diagnosis of  By: Jonny Ruiz MD, Len Blalock   . DEPRESSION 10/14/2007   Qualifier: Diagnosis of  By: Jonny Ruiz MD, Len Blalock   . GERD 10/14/2007   Qualifier: Diagnosis of  By: Jonny Ruiz MD, Len Blalock   . Lumbar disc disease 10/14/2013    Past Surgical History:  Procedure Laterality Date  . BACK SURGERY    . foot surgury    . HAND SURGERY      Social History   Socioeconomic History  . Marital status: Married    Spouse name: Not on file  . Number of children: Not on file  . Years of education: Not on file  . Highest education level: Not on file  Occupational History  . Not on file  Social Needs  . Financial resource strain: Not on file  . Food insecurity:    Worry: Not on file    Inability: Not on file  . Transportation needs:    Medical: Not on file    Non-medical: Not on file  Tobacco Use  . Smoking status:  Current Every Day Smoker    Types: E-cigarettes  . Smokeless tobacco: Never Used  . Tobacco comment: VAPE PEN  Substance and Sexual Activity  . Alcohol use: Yes  . Drug use: No  . Sexual activity: Not on file  Lifestyle  . Physical activity:    Days per week: Not on file    Minutes per session: Not on file  . Stress: Not on file  Relationships  . Social connections:    Talks on phone: Not on file    Gets together: Not on file    Attends religious service: Not on file    Active member of club or organization: Not on file    Attends meetings of clubs or organizations: Not on file    Relationship status: Not on file  . Intimate partner violence:    Fear of current or ex partner: Not on file    Emotionally abused: Not on file    Physically abused: Not on file    Forced sexual activity: Not on file  Other Topics Concern  . Not on file  Social History Narrative  . Not on file    Family History  Problem  Relation Age of Onset  . Hypertension Mother   . Migraines Mother   . Depression Father     Allergies  Allergen Reactions  . Cefaclor Other (See Comments)    Unknown (childhood) Unknown (childhood)     Review of Systems   Review of Systems: Negative Unless Checked Constitutional: [] Weight loss  [] Fever  [] Chills Cardiac: [] Chest pain   []  Atrial Fibrillation  [] Palpitations   [] Shortness of breath when laying flat   [] Shortness of breath with exertion. [] Shortness of breath at rest Vascular:  [] Pain in legs with walking   [] Pain in legs with standing [] Pain in legs when laying flat   [] Claudication    [] Pain in feet when laying flat    [] History of DVT   [] Phlebitis   [] Swelling in legs   [] Varicose veins   [] Non-healing ulcers Pulmonary:   [] Uses home oxygen   [] Productive cough   [] Hemoptysis   [] Wheeze  [] COPD   [] Asthma Neurologic:  [] Dizziness   [] Seizures  [] Blackouts [] History of stroke   [] History of TIA  [] Aphasia   [] Temporary Blindness   [] Weakness or numbness  in arm   [] Weakness or numbness in leg Musculoskeletal:   [] Joint swelling   [] Joint pain   [] Low back pain  []  History of Knee Replacement [x] Arthritis [x] back Surgeries  []  Spinal Stenosis    Hematologic:  [] Easy bruising  [] Easy bleeding   [] Hypercoagulable state   [] Anemic Gastrointestinal:  [] Diarrhea   [] Vomiting  [] Gastroesophageal reflux/heartburn   [] Difficulty swallowing. [] Abdominal pain Genitourinary:  [] Chronic kidney disease   [] Difficult urination  [] Anuric   [] Blood in urine [] Frequent urination  [] Burning with urination   [] Hematuria Skin:  [] Rashes   [] Ulcers [] Wounds Psychological:  [x] History of anxiety   [x]  History of major depression  []  Memory Difficulties      OBJECTIVE:   Physical Exam  BP 125/77 (BP Location: Right Arm)   Pulse (!) 59   Resp 17   Ht 5\' 10"  (1.778 m)   Wt 195 lb 12.8 oz (88.8 kg)   BMI 28.09 kg/m   Gen: WD/WN, NAD Head: [place]/AT, No temporalis wasting.  Ear/Nose/Throat: Hearing grossly intact, nares w/o erythema or drainage Eyes: PER, EOMI, sclera nonicteric.  Neck: Supple, no masses.  No JVD.  Pulmonary:  Good air movement, no use of accessory muscles.  Cardiac: RRR Vascular:  Vessel Right Left  Radial Palpable Palpable  Dorsalis Pedis Palpable Palpable  Posterior Tibial Palpable Palpable   Gastrointestinal: soft, non-distended. No guarding/no peritoneal signs.  Musculoskeletal: M/S 5/5 throughout.  No deformity or atrophy.  Neurologic: Pain and light touch intact in extremities.  Symmetrical.  Speech is fluent. Motor exam as listed above. Psychiatric: Judgment intact, Mood & affect appropriate for pt's clinical situation. Dermatologic: No Venous rashes. No Ulcers Noted.  No changes consistent with cellulitis. Lymph : No Cervical lymphadenopathy, no lichenification or skin changes of chronic lymphedema.       ASSESSMENT AND PLAN:  1. Acute deep vein thrombosis (DVT) of popliteal vein of left lower extremity (HCC) Noninvasive  studies show that there is no evidence left of DVT within the popliteal vein.  Patient previously underwent hypercoagulation studies which did not show any increased risk of hypercoagulation.  Discussed with the patient about anticoagulation and the patient finishes his current bottle of Xarelto he will stop its use.  Discussed with the patient that if he should have gross swelling, pain, redness, again he should be evaluated in the emergency department.  We  would also be happy to continue treatment if another DVT should occur.  Otherwise, he will follow-up on an as-needed basis.  2. Gastroesophageal reflux disease, esophagitis presence not specified Continue PPI as already ordered, this medication has been reviewed and there are no changes at this time.  Avoidence of caffeine and alcohol  Moderate elevation of the head of the bed    Current Outpatient Medications on File Prior to Visit  Medication Sig Dispense Refill  . clonazePAM (KLONOPIN) 0.5 MG tablet Take 1 tablet (0.5 mg total) by mouth 2 (two) times daily as needed. for anxiety 60 tablet 2  . FLUoxetine (PROZAC) 40 MG capsule Take 1 capsule (40 mg total) by mouth daily. 90 capsule 3  . omeprazole (PRILOSEC) 20 MG capsule Take 1 capsule (20 mg total) by mouth daily. 90 capsule 3  . rivaroxaban (XARELTO) 20 MG TABS tablet Take 1 tablet (20 mg total) by mouth daily with supper. 30 tablet 2  . SUMAtriptan (IMITREX) 100 MG tablet Take 1 tablet (100 mg total) by mouth every 2 (two) hours as needed for migraine. May repeat in 2 hours if headache persists or recurs. 10 tablet 5  . Rivaroxaban 15 & 20 MG TBPK Take as directed on package: Start with one  tablet by mouth twice a day with food. On Day 22, switch to one  tablet once a day with food. (Patient not taking: Reported on 01/28/2019) 51 each 0   No current facility-administered medications on file prior to visit.     There are no Patient Instructions on file for this visit.  Return if symptoms worsen or fail to improve.   Georgiana Spinner, NP  This note was completed with Office manager.  Any errors are purely unintentional.

## 2019-02-23 ENCOUNTER — Other Ambulatory Visit (INDEPENDENT_AMBULATORY_CARE_PROVIDER_SITE_OTHER): Payer: Self-pay | Admitting: Nurse Practitioner

## 2019-02-23 DIAGNOSIS — I82432 Acute embolism and thrombosis of left popliteal vein: Secondary | ICD-10-CM

## 2019-03-11 ENCOUNTER — Telehealth (INDEPENDENT_AMBULATORY_CARE_PROVIDER_SITE_OTHER): Payer: Self-pay | Admitting: Nurse Practitioner

## 2019-03-11 NOTE — Telephone Encounter (Signed)
Please advise. AS, CMA 

## 2019-03-11 NOTE — Telephone Encounter (Signed)
Ernest Mathews can either write the letter on Monday or someone in the office can write a letter stating when the patient was last seen and his DVT results stating he does not have an active DVT and have a physician sign it.

## 2019-03-15 ENCOUNTER — Encounter (INDEPENDENT_AMBULATORY_CARE_PROVIDER_SITE_OTHER): Payer: Self-pay | Admitting: Nurse Practitioner

## 2019-03-15 NOTE — Telephone Encounter (Signed)
Note has been printed and given to front desk. Left message on number provided by patient advising this has been done and where to pick up note. AS< CMA

## 2019-03-15 NOTE — Telephone Encounter (Signed)
If we can get the patient a note that states "Based on the patient's last examination on 01/28/2019, the patient no longer has any current DVT in his left lower extremity. The patient is to follow up with our office on an as needed basis."

## 2019-04-02 ENCOUNTER — Other Ambulatory Visit (INDEPENDENT_AMBULATORY_CARE_PROVIDER_SITE_OTHER): Payer: Self-pay | Admitting: Nurse Practitioner

## 2019-04-28 ENCOUNTER — Encounter (INDEPENDENT_AMBULATORY_CARE_PROVIDER_SITE_OTHER): Payer: Self-pay

## 2019-06-23 ENCOUNTER — Other Ambulatory Visit: Payer: Self-pay | Admitting: Internal Medicine

## 2019-06-23 DIAGNOSIS — M79605 Pain in left leg: Secondary | ICD-10-CM

## 2019-06-28 ENCOUNTER — Ambulatory Visit
Admission: RE | Admit: 2019-06-28 | Discharge: 2019-06-28 | Disposition: A | Discharge status: Home / Self Care | Payer: BLUE CROSS/BLUE SHIELD | Source: Ambulatory Visit | Attending: Internal Medicine | Admitting: Internal Medicine

## 2019-06-28 DIAGNOSIS — M79605 Pain in left leg: Secondary | ICD-10-CM

## 2019-12-26 ENCOUNTER — Encounter (HOSPITAL_COMMUNITY): Payer: Self-pay | Admitting: Emergency Medicine

## 2019-12-26 ENCOUNTER — Emergency Department (HOSPITAL_COMMUNITY)
Admission: EM | Admit: 2019-12-26 | Discharge: 2019-12-26 | Disposition: A | Discharge status: Home / Self Care | Payer: BLUE CROSS/BLUE SHIELD | Attending: Emergency Medicine | Admitting: Emergency Medicine

## 2019-12-26 ENCOUNTER — Other Ambulatory Visit: Payer: Self-pay

## 2019-12-26 DIAGNOSIS — Z79899 Other long term (current) drug therapy: Secondary | ICD-10-CM | POA: Insufficient documentation

## 2019-12-26 DIAGNOSIS — M5441 Lumbago with sciatica, right side: Secondary | ICD-10-CM | POA: Insufficient documentation

## 2019-12-26 DIAGNOSIS — G8929 Other chronic pain: Secondary | ICD-10-CM | POA: Insufficient documentation

## 2019-12-26 DIAGNOSIS — Z7901 Long term (current) use of anticoagulants: Secondary | ICD-10-CM | POA: Insufficient documentation

## 2019-12-26 DIAGNOSIS — F17290 Nicotine dependence, other tobacco product, uncomplicated: Secondary | ICD-10-CM | POA: Insufficient documentation

## 2019-12-26 MED ORDER — DEXAMETHASONE SODIUM PHOSPHATE 10 MG/ML IJ SOLN
10.0000 mg | Freq: Once | INTRAMUSCULAR | Status: AC
  Administered 2019-12-26: 10 mg via INTRAMUSCULAR
  Filled 2019-12-26: qty 1

## 2019-12-26 MED ORDER — METHOCARBAMOL 750 MG PO TABS: 750.0000 mg | ORAL_TABLET | Freq: Two times a day (BID) | ORAL | 0 refills | Status: DC | PRN

## 2019-12-26 MED ORDER — LIDOCAINE 5 % EX PTCH: 1.0000 | MEDICATED_PATCH | CUTANEOUS | 0 refills | Status: DC

## 2019-12-26 NOTE — Discharge Instructions (Signed)
  Take it easy, but do not lay around too much as this may make any stiffness worse.  Antiinflammatory medications: Take 600 mg of ibuprofen every 6 hours or 440 mg (over the counter dose) to 500 mg (prescription dose) of naproxen every 12 hours for the next 3 days. After this time, these medications may be used as needed for pain. Take these medications with food to avoid upset stomach. Choose only one of these medications, do not take them together. Acetaminophen (generic for Tylenol): Should you continue to have additional pain while taking the ibuprofen or naproxen, you may add in acetaminophen as needed. Your daily total maximum amount of acetaminophen from all sources should be limited to 4000mg/day for persons without liver problems, or 2000mg/day for those with liver problems. Methocarbamol: Methocarbamol (generic for Robaxin) is a muscle relaxer and can help relieve stiff muscles or muscle spasms.  Do not drive or perform other dangerous activities while taking this medication as it can cause drowsiness as well as changes in reaction time and judgement. Lidocaine patches: These are available via either prescription or over-the-counter. The over-the-counter option may be more economical one and are likely just as effective. There are multiple over-the-counter brands, such as Salonpas. Ice: May apply ice to the area over the next 24 hours for 15 minutes at a time to reduce pain, inflammation, and swelling, if present. Exercises: Be sure to perform the attached exercises starting with three times a week and working up to performing them daily. This is an essential part of preventing long term problems.  Follow up: Follow up with a primary care provider for any future management of these complaints. Be sure to follow up within 7-10 days. Return: Return to the ED should symptoms worsen.  For prescription assistance, may try using prescription discount sites or apps, such as goodrx.com 

## 2019-12-26 NOTE — ED Provider Notes (Addendum)
[facility] EMERGENCY DEPARTMENT Provider Note   CSN: [id number] Arrival date & time: 12/26/19  1127     History Chief Complaint  Patient presents with  . Back Pain    Ernest Berry. is a 34 y.o. male.  HPI      Ernest Perrow. is a 34 y.o. male, with a history of lumbar disc surgery, anxiety, depression, presenting to the ED with acute on chronic back pain for the past several days.  Pain is to the right lower back, moderate to severe, tightness and spasming with pain extending down the right leg.  Paresthesias to the right leg intermittently. He has been taking Aleve without resolution. He states he has had flares of similar pain intermittently since his back surgery several years ago.  Denies fever/chills, abdominal pain, weakness, numbness, changes in bowel or bladder function, saddle anesthesias, or any other complaints.   Past Medical History:  Diagnosis Date  . Allergic rhinitis, cause unspecified 03/10/2013  . ANXIETY 10/14/2007   Qualifier: Diagnosis of  By: Jonny Ruiz MD, Len Blalock   . COMMON MIGRAINE 10/14/2007   Qualifier: Diagnosis of  By: Jonny Ruiz MD, Len Blalock   . DEPRESSION 10/14/2007   Qualifier: Diagnosis of  By: Jonny Ruiz MD, Len Blalock   . GERD 10/14/2007   Qualifier: Diagnosis of  By: Jonny Ruiz MD, Len Blalock   . Lumbar disc disease 10/14/2013    Patient Active Problem List   Diagnosis Date Noted  . DVT of popliteal vein (HCC) 10/28/2018  . Preventative health care 10/14/2013  . Lumbar disc disease 10/14/2013  . Right ureteral stone 10/14/2013  . Low back pain 08/16/2013  . Allergic rhinitis 03/10/2013  . Erectile dysfunction 07/21/2012  . Anxiety state 10/14/2007  . Depression 10/14/2007  . Migraine without aura 10/14/2007  . GERD 10/14/2007    Past Surgical History:  Procedure Laterality Date  . BACK SURGERY    . foot surgury    . HAND SURGERY         Family History  Problem Relation Age of Onset  . Hypertension Mother   . Migraines Mother     . Depression Father     Social History   Tobacco Use  . Smoking status: Current Every Day Smoker    Types: E-cigarettes  . Smokeless tobacco: Never Used  . Tobacco comment: VAPE PEN  Substance Use Topics  . Alcohol use: Yes  . Drug use: No    Home Medications Prior to Admission medications   Medication Sig Start Date End Date Taking? Authorizing Provider  clonazePAM (KLONOPIN) 0.5 MG tablet Take 1 tablet (0.5 mg total) by mouth 2 (two) times daily as needed. for anxiety 03/15/15   Corwin Levins, MD  FLUoxetine (PROZAC) 40 MG capsule Take 1 capsule (40 mg total) by mouth daily. 03/15/15   Corwin Levins, MD  lidocaine (LIDODERM) 5 % Place 1 patch onto the skin daily. Remove & Discard patch within 12 hours or as directed by MD 12/26/19   Elora Wolter, Hillard Danker, PA-C  methocarbamol (ROBAXIN) 750 MG tablet Take 1 tablet (750 mg total) by mouth 2 (two) times daily as needed for muscle spasms (or muscle tightness). 12/26/19   Jodiann Ognibene C, PA-C  omeprazole (PRILOSEC) 20 MG capsule Take 1 capsule (20 mg total) by mouth daily. 03/15/15   Corwin Levins, MD  rivaroxaban (XARELTO) 20 MG TABS tablet Take 1 tablet (20 mg total) by mouth daily with supper. 10/28/18  Kris Hartmann, NP  Rivaroxaban 15 & 20 MG TBPK Take as directed on package: Start with one 15mg  tablet by mouth twice a day with food. On Day 22, switch to one 20mg  tablet once a day with food. Patient not taking: Reported on 01/28/2019 10/25/18   Menshew, Dannielle Karvonen, PA-C  SUMAtriptan (IMITREX) 100 MG tablet Take 1 tablet (100 mg total) by mouth every 2 (two) hours as needed for migraine. May repeat in 2 hours if headache persists or recurs. 03/15/15   Biagio Borg, MD    Allergies    Cefaclor  Review of Systems   Review of Systems  Constitutional: Negative for chills, diaphoresis and fever.  Gastrointestinal: Negative for abdominal pain, nausea and vomiting.  Genitourinary: Negative for difficulty urinating.  Musculoskeletal: Positive for back  pain.  Neurological: Negative for weakness and numbness.  All other systems reviewed and are negative.   Physical Exam Updated Vital Signs BP 124/85   Pulse 72   Temp 98.4 F (36.9 C)   Resp 16   SpO2 95%   Physical Exam Vitals and nursing note reviewed.  Constitutional:      General: He is not in acute distress.    Appearance: He is well-developed. He is not diaphoretic.  HENT:     Head: Normocephalic and atraumatic.     Mouth/Throat:     Mouth: Mucous membranes are moist.     Pharynx: Oropharynx is clear.  Eyes:     Conjunctiva/sclera: Conjunctivae normal.  Cardiovascular:     Rate and Rhythm: Normal rate and regular rhythm.     Pulses: Normal pulses.          Posterior tibial pulses are 2+ on the right side and 2+ on the left side.     Comments: Tactile temperature in the extremities appropriate and equal bilaterally. Pulmonary:     Effort: Pulmonary effort is normal. No respiratory distress.  Abdominal:     Palpations: Abdomen is soft.     Tenderness: There is no abdominal tenderness. There is no guarding.  Musculoskeletal:     Cervical back: Neck supple.       Back:     Right lower leg: No edema.     Left lower leg: No edema.     Comments: Normal motor function intact in all extremities. No midline spinal tenderness.   Skin:    General: Skin is warm and dry.  Neurological:     Mental Status: He is alert.     Deep Tendon Reflexes:     Reflex Scores:      Patellar reflexes are 2+ on the right side and 2+ on the left side.    Comments: Sensation grossly intact to light touch in the lower extremities bilaterally. No saddle anesthesias. Strength 5/5 in the bilateral lower extremities. Ambulates independently with slow, antalgic gait. Coordination intact.  Psychiatric:        Mood and Affect: Mood and affect normal.        Speech: Speech normal.        Behavior: Behavior normal.     ED Results / Procedures / Treatments   Labs (all labs ordered are listed,  but only abnormal results are displayed) Labs Reviewed - No data to display  EKG None  Radiology No results found.  Procedures Procedures (including critical care time)  Medications Ordered in ED Medications  dexamethasone (DECADRON) injection 10 mg (has no administration in time range)    ED Course  I have reviewed the triage vital signs and the nursing notes.  Pertinent labs & imaging results that were available during my care of the patient were reviewed by me and considered in my medical decision making (see chart for details).    MDM Rules/Calculators/A&P                      Patient presents with acute on chronic right lower back pain with symptoms suggestive of sciatica.  No findings to suggest vascular compromise.  No symptoms or findings to suggest neurosurgical emergency, such as cauda equina. The patient was given instructions for home care as well as return precautions. Patient voices understanding of these instructions, accepts the plan, and is comfortable with discharge.   Final Clinical Impression(s) / ED Diagnoses Final diagnoses:  Chronic right-sided low back pain with right-sided sciatica    Rx / DC Orders ED Discharge Orders         Ordered    methocarbamol (ROBAXIN) 750 MG tablet  2 times daily PRN     12/26/19 1317    lidocaine (LIDODERM) 5 %  Every 24 hours     12/26/19 1317           Anselm Pancoast, PA-C 12/26/19 1326    Anselm Pancoast, PA-C 12/26/19 1326    Mesner, Barbara Cower, MD 12/26/19 1420

## 2019-12-26 NOTE — ED Triage Notes (Signed)
Patient reports hx of back surgery in 2015, states he started having lower back spasms 5-6 days ago, cannot relate pain to any specific injury or event. Reports some pain and numbness to his R leg. Denies loss of bowel or bladder. Ambulatory to triage with steady gait. Taking aleve at home w/o relief.

## 2020-03-18 ENCOUNTER — Emergency Department (HOSPITAL_BASED_OUTPATIENT_CLINIC_OR_DEPARTMENT_OTHER): Payer: 59

## 2020-03-18 ENCOUNTER — Encounter (HOSPITAL_COMMUNITY): Payer: Self-pay

## 2020-03-18 ENCOUNTER — Emergency Department (HOSPITAL_COMMUNITY)
Admission: EM | Admit: 2020-03-18 | Discharge: 2020-03-18 | Disposition: A | Discharge status: Home / Self Care | Payer: 59 | Attending: Emergency Medicine | Admitting: Emergency Medicine

## 2020-03-18 DIAGNOSIS — F17290 Nicotine dependence, other tobacco product, uncomplicated: Secondary | ICD-10-CM | POA: Insufficient documentation

## 2020-03-18 DIAGNOSIS — M79605 Pain in left leg: Secondary | ICD-10-CM

## 2020-03-18 DIAGNOSIS — M25472 Effusion, left ankle: Secondary | ICD-10-CM | POA: Diagnosis not present

## 2020-03-18 DIAGNOSIS — M79662 Pain in left lower leg: Secondary | ICD-10-CM | POA: Insufficient documentation

## 2020-03-18 DIAGNOSIS — Z86718 Personal history of other venous thrombosis and embolism: Secondary | ICD-10-CM | POA: Insufficient documentation

## 2020-03-18 DIAGNOSIS — M25572 Pain in left ankle and joints of left foot: Secondary | ICD-10-CM | POA: Diagnosis present

## 2020-03-18 DIAGNOSIS — Z79899 Other long term (current) drug therapy: Secondary | ICD-10-CM | POA: Diagnosis not present

## 2020-03-18 LAB — BASIC METABOLIC PANEL
Anion gap: 11 (ref 5–15)
BUN: 12 mg/dL (ref 6–20)
CO2: 23 mmol/L (ref 22–32)
Calcium: 9.4 mg/dL (ref 8.9–10.3)
Chloride: 107 mmol/L (ref 98–111)
Creatinine, Ser: 1.03 mg/dL (ref 0.61–1.24)
GFR calc Af Amer: >60 mL/min (ref >60)
GFR calc non Af Amer: >60 mL/min (ref >60)
Glucose, Bld: 129 mg/dL — ABNORMAL HIGH (ref 70–99)
Potassium: 3.7 mmol/L (ref 3.5–5.1)
Sodium: 141 mmol/L (ref 135–145)

## 2020-03-18 LAB — CBC
HCT: 45 % (ref 39.0–52.0)
Hemoglobin: 15.4 g/dL (ref 13.0–17.0)
MCH: 30.3 pg (ref 26.0–34.0)
MCHC: 34.2 g/dL (ref 30.0–36.0)
MCV: 88.6 fL (ref 80.0–100.0)
Platelets: 210 10*3/uL (ref 150–400)
RBC: 5.08 MIL/uL (ref 4.22–5.81)
RDW: 12 % (ref 11.5–15.5)
WBC: 5.8 10*3/uL (ref 4.0–10.5)
nRBC: 0 % (ref 0.0–0.2)

## 2020-03-18 NOTE — ED Notes (Signed)
Patient verbalizes understanding of discharge instructions. Opportunity for questioning and answers were provided. Armband removed by staff, pt discharged from ED.  

## 2020-03-18 NOTE — Discharge Instructions (Addendum)
The ultrasound test today did not show a blood clot in the left leg.  The swelling you are having appears to be mostly in the ankle.  Since you are not having pain when I examined the foot, this is not expected to be a serious problem.  This type of swelling may improve by elevation of your feet above your heart, when you are not having to work.  To make sure there is no complication with the vein, I recommend that you see the vein doctor that you previously saw to be evaluated for venous insufficiency.  You do not need to start taking anticoagulant medications, again, at this time.

## 2020-03-18 NOTE — ED Triage Notes (Signed)
Pt arrives to ED w/ c/o L knee pain and LLE edema. Pt has hx of blood DVT in that leg and states this is similar.

## 2020-03-18 NOTE — ED Provider Notes (Signed)
[facility] EMERGENCY DEPARTMENT Provider Note   CSN: [id number] Arrival date & time: 03/18/20  1256     History Chief Complaint  Patient presents with   Leg Pain    Ernest Brum. is a 34 y.o. male.  HPI He presents for evaluation of atraumatic left ankle swelling, which started 2 weeks ago.  He works as a Teaching laboratory technician.  He has previously been treated for a blood clot of the left leg, and was on Xarelto for about 6 months.  He is not currently taking Xarelto.  He does not know of any specific trauma.  He denies pain in his left ankle or foot.  He is able to walk.  He denies shortness of breath, chest pain, fever or chills.  There are no other known modifying factors.    Past Medical History:  Diagnosis Date   Allergic rhinitis, cause unspecified 03/10/2013   ANXIETY 10/14/2007   Qualifier: Diagnosis of  By: Jonny Ruiz MD, Len Blalock    COMMON MIGRAINE 10/14/2007   Qualifier: Diagnosis of  By: Jonny Ruiz MD, Len Blalock    DEPRESSION 10/14/2007   Qualifier: Diagnosis of  By: Jonny Ruiz MD, Len Blalock    GERD 10/14/2007   Qualifier: Diagnosis of  By: Jonny Ruiz MD, Len Blalock    Lumbar disc disease 10/14/2013    Patient Active Problem List   Diagnosis Date Noted   DVT of popliteal vein (HCC) 10/28/2018   Preventative health care 10/14/2013   Lumbar disc disease 10/14/2013   Right ureteral stone 10/14/2013   Low back pain 08/16/2013   Allergic rhinitis 03/10/2013   Erectile dysfunction 07/21/2012   Anxiety state 10/14/2007   Depression 10/14/2007   Migraine without aura 10/14/2007   GERD 10/14/2007    Past Surgical History:  Procedure Laterality Date   BACK SURGERY     foot surgury     HAND SURGERY         Family History  Problem Relation Age of Onset   Hypertension Mother    Migraines Mother    Depression Father     Social History   Tobacco Use   Smoking status: Current Every Day Smoker    Types: E-cigarettes   Smokeless tobacco: Never Used    Tobacco comment: VAPE PEN  Vaping Use   Vaping Use: Every day  Substance Use Topics   Alcohol use: Yes   Drug use: No    Home Medications Prior to Admission medications   Medication Sig Start Date End Date Taking? Authorizing Provider  clonazePAM (KLONOPIN) 0.5 MG tablet Take 1 tablet (0.5 mg total) by mouth 2 (two) times daily as needed. for anxiety 03/15/15   Corwin Levins, MD  FLUoxetine (PROZAC) 40 MG capsule Take 1 capsule (40 mg total) by mouth daily. 03/15/15   Corwin Levins, MD  lidocaine (LIDODERM) 5 % Place 1 patch onto the skin daily. Remove & Discard patch within 12 hours or as directed by MD 12/26/19   Joy, Hillard Danker, PA-C  methocarbamol (ROBAXIN) 750 MG tablet Take 1 tablet (750 mg total) by mouth 2 (two) times daily as needed for muscle spasms (or muscle tightness). 12/26/19   Joy, Shawn C, PA-C  omeprazole (PRILOSEC) 20 MG capsule Take 1 capsule (20 mg total) by mouth daily. 03/15/15   Corwin Levins, MD  rivaroxaban (XARELTO) 20 MG TABS tablet Take 1 tablet (20 mg total) by mouth daily with supper. 10/28/18   Georgiana Spinner, NP  Rivaroxaban 15 & 20 MG TBPK Take as directed on package: Start with one 15mg  tablet by mouth twice a day with food. On Day 22, switch to one 20mg  tablet once a day with food. Patient not taking: Reported on 01/28/2019 10/25/18   Menshew, 01/30/2019, PA-C  SUMAtriptan (IMITREX) 100 MG tablet Take 1 tablet (100 mg total) by mouth every 2 (two) hours as needed for migraine. May repeat in 2 hours if headache persists or recurs. 03/15/15   Charlesetta Ivory, MD    Allergies    Cefaclor  Review of Systems   Review of Systems  All other systems reviewed and are negative.   Physical Exam Updated Vital Signs BP 118/78    Pulse 66    Temp 98.3 F (36.8 C) (Oral)    Resp 18    SpO2 99%   Physical Exam Vitals and nursing note reviewed.  Constitutional:      General: He is not in acute distress.    Appearance: He is well-developed. He is not ill-appearing or  diaphoretic.  HENT:     Head: Normocephalic and atraumatic.     Right Ear: External ear normal.     Left Ear: External ear normal.  Eyes:     Conjunctiva/sclera: Conjunctivae normal.     Pupils: Pupils are equal, round, and reactive to light.  Neck:     Trachea: Phonation normal.  Cardiovascular:     Rate and Rhythm: Normal rate.  Pulmonary:     Effort: Pulmonary effort is normal.  Abdominal:     General: There is no distension.  Musculoskeletal:        General: Normal range of motion.     Cervical back: Normal range of motion and neck supple.     Comments: Mild swelling left ankle, primarily medial.  The swelling does not extend into the left lower leg, or the left foot proper.  Neurovascular intact distally in the left toes.  No erythema overlying the left ankle swelling.  Skin:    General: Skin is warm and dry.  Neurological:     Mental Status: He is alert and oriented to person, place, and time.     Cranial Nerves: No cranial nerve deficit.     Sensory: No sensory deficit.     Motor: No abnormal muscle tone.     Coordination: Coordination normal.  Psychiatric:        Mood and Affect: Mood normal.        Behavior: Behavior normal.        Thought Content: Thought content normal.        Judgment: Judgment normal.     ED Results / Procedures / Treatments   Labs (all labs ordered are listed, but only abnormal results are displayed) Labs Reviewed  BASIC METABOLIC PANEL - Abnormal; Notable for the following components:      Result Value   Glucose, Bld 129 (*)    All other components within normal limits  CBC    EKG None  Radiology VAS 05/15/15 LOWER EXTREMITY VENOUS (DVT) (ONLY [facility] & [facility])  Result Date: 03/18/2020  Lower Venous DVT Study Indications: Left leg knee pain and edema.  Risk Factors: DVT Hx of DVT left popliteal 10/25/18. Limitations: Clothing. Comparison Study: Prior study from 06/28/19 is available for comparison Performing Technologist: 10/27/18 RVS   Examination Guidelines: A complete evaluation includes B-mode imaging, spectral Doppler, color Doppler, and power Doppler as needed of all accessible portions of  each vessel. Bilateral testing is considered an integral part of a complete examination. Limited examinations for reoccurring indications may be performed as noted. The reflux portion of the exam is performed with the patient in reverse Trendelenburg.  +---------+---------------+---------+-----------+----------+--------------+  LEFT      Compressibility Phasicity Spontaneity Properties Thrombus Aging  +---------+---------------+---------+-----------+----------+--------------+  CFV                       Yes       Yes                                    +---------+---------------+---------+-----------+----------+--------------+  FV Prox   Full            Yes       Yes                                    +---------+---------------+---------+-----------+----------+--------------+  FV Mid    Full                                                             +---------+---------------+---------+-----------+----------+--------------+  FV Distal Full                                                             +---------+---------------+---------+-----------+----------+--------------+  PFV       Full                                                             +---------+---------------+---------+-----------+----------+--------------+  POP       Full            Yes       Yes                                    +---------+---------------+---------+-----------+----------+--------------+  PTV       Full                                                             +---------+---------------+---------+-----------+----------+--------------+  PERO      Full                                                             +---------+---------------+---------+-----------+----------+--------------+  Gastroc   Full                                                              +---------+---------------+---------+-----------+----------+--------------+  GSV       Full                                                             +---------+---------------+---------+-----------+----------+--------------+  SSV       Full                                                             +---------+---------------+---------+-----------+----------+--------------+   Right Technical Findings: Right leg not evaluated.    Summary: RIGHT: - No evidence of common femoral vein obstruction.  LEFT: - Findings appear essentially unchanged compared to previous examination. - There is no evidence of deep vein thrombosis in the lower extremity.  *See table(s) above for measurements and observations.    Preliminary     Procedures Procedures (including critical care time)  Medications Ordered in ED Medications - No data to display  ED Course  I have reviewed the triage vital signs and the nursing notes.  Pertinent labs & imaging results that were available during my care of the patient were reviewed by me and considered in my medical decision making (see chart for details).    MDM Rules/Calculators/A&P                           Patient Vitals for the past 24 hrs:  BP Temp Temp src Pulse Resp SpO2  03/18/20 1326 118/78 98.3 F (36.8 C) Oral 66 18 99 %    3:02 PM Reevaluation with update and discussion. After initial assessment and treatment, an updated evaluation reveals no change in status, findings discussed and questions answered. Mancel Bale   Medical Decision Making:  This patient is presenting for evaluation of atraumatic left ankle swelling, which does require a range of treatment options, and is a complaint that involves a moderate risk of morbidity and mortality. The differential diagnoses include arthritis, DVT, cellulitis. I decided to review old records, and in summary he previously had a left leg DVT, which was unprovoked, and he is not currently on Xarelto..  I did not  require additional historical information from anyone.   I ordered and Reviewed the left leg ultrasound/Doppler medicine Test.  No DVT was present.  Critical Interventions-clinical evaluation, observation, reassessment  After These Interventions, the Patient was reevaluated and was found stable for discharge.  No evidence for left leg DVT, significant arthritis or cellulitis  CRITICAL CARE-no Performed by: Mancel Bale  Nursing Notes Reviewed/ Care Coordinated Applicable Imaging Reviewed Interpretation of Laboratory Data incorporated into ED treatment  The patient appears reasonably screened and/or stabilized for discharge and I doubt any other medical condition or other [facility] requiring further screening, evaluation, or treatment in the ED at this time prior to discharge.  Plan: Home Medications-OTC of choice; Home Treatments-elevate feet above heart to improve swelling.; return here if the recommended treatment, does not improve the symptoms; Recommended follow up-PCP follow-up as needed.  Consider seeing a vein specialist, to be evaluated for ongoing vein problems, possibly leading to the swelling.     Final Clinical Impression(s) / ED Diagnoses  Final diagnoses:  Left ankle swelling    Rx / DC Orders ED Discharge Orders    None       Daleen Bo, MD 03/18/20 1505

## 2020-03-18 NOTE — Progress Notes (Signed)
VASCULAR LAB PRELIMINARY  PRELIMINARY  PRELIMINARY  PRELIMINARY  Left lower extremity venous duplex completed.    Preliminary report:  See CV proc for preliminary results.  Called report to Dr. Georgana Curio, [facility], RVT 03/18/2020, 2:30 PM

## 2020-06-29 ENCOUNTER — Encounter: Payer: Self-pay | Admitting: Pulmonary Disease

## 2020-06-29 ENCOUNTER — Ambulatory Visit (INDEPENDENT_AMBULATORY_CARE_PROVIDER_SITE_OTHER): Payer: 59 | Admitting: Pulmonary Disease

## 2020-06-29 ENCOUNTER — Other Ambulatory Visit: Payer: Self-pay

## 2020-06-29 VITALS — BP 116/82 | HR 61 | Temp 97.2°F | Ht 68.7 in | Wt 221.2 lb

## 2020-06-29 DIAGNOSIS — G4700 Insomnia, unspecified: Secondary | ICD-10-CM | POA: Insufficient documentation

## 2020-06-29 DIAGNOSIS — G4733 Obstructive sleep apnea (adult) (pediatric): Secondary | ICD-10-CM

## 2020-06-29 DIAGNOSIS — F5104 Psychophysiologic insomnia: Secondary | ICD-10-CM | POA: Diagnosis not present

## 2020-06-29 NOTE — Patient Instructions (Signed)
Schedule home sleep test.  Trial of melatonin 5 to 10 mg before bedtime

## 2020-06-29 NOTE — Assessment & Plan Note (Signed)
Appears to be longstanding.  Rules of sleep hygiene were discussed.  I advised him against using alcohol as a sleep aid. Can use melatonin 5 to 10 mg OTC but really focus should be on sleep hygiene and CBT

## 2020-06-29 NOTE — Assessment & Plan Note (Signed)
OSA was noted to be mild in 2018.  He could not afford a CPAP machine then.  It is possible that with his weight gain of 40 pounds since then, OSA is worsened. We will undertake a home sleep test to reassess  The pathophysiology of obstructive sleep apnea , it's cardiovascular consequences & modes of treatment including CPAP were discused with the patient in detail & they evidenced understanding. He would be amenable to using his CPAP machine depending on his co-pay.  Nasal pillows should suffice

## 2020-06-29 NOTE — Progress Notes (Signed)
Subjective:    Patient ID: Carolyne Littles., male    DOB: [birth year], 34 y.o.   MRN: [id number]  HPI   Chief Complaint  Patient presents with  . Consult    Patient has trouble falling asleep and staying asleep, snores denies gasping for air, Wakes up 3-4 times a night.    34 year old man presents for evaluation of difficulty maintaining sleep and frequent nocturnal awakenings. He states that he is always have trouble falling asleep even as a teenager.  As an adult his bedtime has been shifted earlier due to work schedule.  He now works at 8 AM so his wake-up time is around 6 AM.  Bedtime is around 10 PM, sleep latency can be up to 1 to 2 hours, he sleeps on his side with 1 pillow, reports 3-4 nocturnal awakenings including nocturia and is out of bed at 6 AM feeling tired without dryness about and occasional headaches. There is no history suggestive of cataplexy, sleep paralysis or parasomnias  He had home sleep test performed in 2018 which showed mild OSA, he could not afford a CPAP machine then.  He had left leg superficial thrombus in 10/2018 and was on Xarelto for 3 months He reports generalized anxiety for which he takes clonazepam in the daytime, he reports some stressors but states that he had an easy job.  He keeps his kids 7 and 13 every other week, he has been single for 7 years  He quit smoking in 2012 but vapes.  He drinks 2-3 beers every day   Significant tests/ events reviewed  07/2017 HST-RDI 11/hour   Past Medical History:  Diagnosis Date  . Allergic rhinitis, cause unspecified 03/10/2013  . ANXIETY 10/14/2007   Qualifier: Diagnosis of  By: Jonny Ruiz MD, Len Blalock   . COMMON MIGRAINE 10/14/2007   Qualifier: Diagnosis of  By: Jonny Ruiz MD, Len Blalock   . DEPRESSION 10/14/2007   Qualifier: Diagnosis of  By: Jonny Ruiz MD, Len Blalock   . GERD 10/14/2007   Qualifier: Diagnosis of  By: Jonny Ruiz MD, Len Blalock   . Lumbar disc disease 10/14/2013    Past Surgical History:  Procedure Laterality Date  . BACK  SURGERY    . foot surgury    . HAND SURGERY      Allergies  Allergen Reactions  . Cefaclor Other (See Comments)    Unknown (childhood) Unknown (childhood)    Social History   Socioeconomic History  . Marital status: Married    Spouse name: Not on file  . Number of children: Not on file  . Years of education: Not on file  . Highest education level: Not on file  Occupational History  . Not on file  Tobacco Use  . Smoking status: Current Every Day Smoker    Types: E-cigarettes  . Smokeless tobacco: Never Used  . Tobacco comment: VAPE PEN  Vaping Use  . Vaping Use: Every day  Substance and Sexual Activity  . Alcohol use: Yes  . Drug use: No  . Sexual activity: Not on file  Other Topics Concern  . Not on file  Social History Narrative  . Not on file   Social Determinants of Health   Financial Resource Strain:   . Difficulty of Paying Living Expenses: Not on file  Food Insecurity:   . Worried About Programme researcher, broadcasting/film/video in the Last Year: Not on file  . Ran Out of Food in the Last Year: Not on file  Transportation  Needs:   . Lack of Transportation (Medical): Not on file  . Lack of Transportation (Non-Medical): Not on file  Physical Activity:   . Days of Exercise per Week: Not on file  . Minutes of Exercise per Session: Not on file  Stress:   . Feeling of Stress : Not on file  Social Connections:   . Frequency of Communication with Friends and Family: Not on file  . Frequency of Social Gatherings with Friends and Family: Not on file  . Attends Religious Services: Not on file  . Active Member of Clubs or Organizations: Not on file  . Attends Banker Meetings: Not on file  . Marital Status: Not on file  Intimate Partner Violence:   . Fear of Current or Ex-Partner: Not on file  . Emotionally Abused: Not on file  . Physically Abused: Not on file  . Sexually Abused: Not on file     Family History  Problem Relation Age of Onset  . Hypertension Mother    . Migraines Mother   . Depression Father      Review of Systems Acid heartburn Indigestion Weight gain 40 pounds over the last 3 years Headaches and nasal congestion Anxiety and depression      Objective:   Physical Exam  Gen. Pleasant, well-nourished, flat affect, in no distress ENT - no thrush, no pallor/icterus,no post nasal drip Neck: No JVD, no thyromegaly, no carotid bruits Lungs: no use of accessory muscles, no dullness to percussion, clear without rales or rhonchi  Cardiovascular: Rhythm regular, heart sounds  normal, no murmurs or gallops, no peripheral edema Musculoskeletal: No deformities, no cyanosis or clubbing        Assessment & Plan:

## 2020-08-14 ENCOUNTER — Encounter: Payer: Self-pay | Admitting: Pulmonary Disease

## 2020-11-22 ENCOUNTER — Ambulatory Visit: Payer: Self-pay

## 2020-11-22 ENCOUNTER — Other Ambulatory Visit: Payer: Self-pay | Admitting: Occupational Medicine

## 2020-11-22 ENCOUNTER — Other Ambulatory Visit: Payer: Self-pay

## 2020-11-22 DIAGNOSIS — Z0000 Encounter for general adult medical examination without abnormal findings: Secondary | ICD-10-CM

## 2021-02-23 ENCOUNTER — Emergency Department (HOSPITAL_COMMUNITY)
Admission: EM | Admit: 2021-02-23 | Discharge: 2021-02-24 | Disposition: A | Discharge status: Home / Self Care | Payer: Self-pay | Attending: Emergency Medicine | Admitting: Emergency Medicine

## 2021-02-23 ENCOUNTER — Other Ambulatory Visit: Payer: Self-pay

## 2021-02-23 DIAGNOSIS — Z7901 Long term (current) use of anticoagulants: Secondary | ICD-10-CM | POA: Insufficient documentation

## 2021-02-23 DIAGNOSIS — M79605 Pain in left leg: Secondary | ICD-10-CM | POA: Insufficient documentation

## 2021-02-23 DIAGNOSIS — R2242 Localized swelling, mass and lump, left lower limb: Secondary | ICD-10-CM | POA: Insufficient documentation

## 2021-02-23 DIAGNOSIS — Z5321 Procedure and treatment not carried out due to patient leaving prior to being seen by health care provider: Secondary | ICD-10-CM | POA: Insufficient documentation

## 2021-02-23 DIAGNOSIS — M25562 Pain in left knee: Secondary | ICD-10-CM | POA: Insufficient documentation

## 2021-02-23 DIAGNOSIS — Z86718 Personal history of other venous thrombosis and embolism: Secondary | ICD-10-CM | POA: Insufficient documentation

## 2021-02-23 LAB — COMPREHENSIVE METABOLIC PANEL
ALT: 32 U/L (ref 0–44)
AST: 21 U/L (ref 15–41)
Albumin: 3.9 g/dL (ref 3.5–5.0)
Alkaline Phosphatase: 71 U/L (ref 38–126)
Anion gap: 8 (ref 5–15)
BUN: 15 mg/dL (ref 6–20)
CO2: 23 mmol/L (ref 22–32)
Calcium: 9.3 mg/dL (ref 8.9–10.3)
Chloride: 107 mmol/L (ref 98–111)
Creatinine, Ser: 1.07 mg/dL (ref 0.61–1.24)
GFR, Estimated: >60 mL/min (ref >60)
Glucose, Bld: 99 mg/dL (ref 70–99)
Potassium: 3.7 mmol/L (ref 3.5–5.1)
Sodium: 138 mmol/L (ref 135–145)
Total Bilirubin: 0.8 mg/dL (ref 0.3–1.2)
Total Protein: 7.1 g/dL (ref 6.5–8.1)

## 2021-02-23 LAB — CBC WITH DIFFERENTIAL/PLATELET
Abs Immature Granulocytes: 0.04 10*3/uL (ref 0.00–0.07)
Basophils Absolute: 0 10*3/uL (ref 0.0–0.1)
Basophils Relative: 0 %
Eosinophils Absolute: 0.2 10*3/uL (ref 0.0–0.5)
Eosinophils Relative: 2 %
HCT: 41.5 % (ref 39.0–52.0)
Hemoglobin: 14.1 g/dL (ref 13.0–17.0)
Immature Granulocytes: 1 %
Lymphocytes Relative: 26 %
Lymphs Abs: 1.6 10*3/uL (ref 0.7–4.0)
MCH: 30.4 pg (ref 26.0–34.0)
MCHC: 34 g/dL (ref 30.0–36.0)
MCV: 89.4 fL (ref 80.0–100.0)
Monocytes Absolute: 0.6 10*3/uL (ref 0.1–1.0)
Monocytes Relative: 10 %
Neutro Abs: 3.8 10*3/uL (ref 1.7–7.7)
Neutrophils Relative %: 61 %
Platelets: 192 10*3/uL (ref 150–400)
RBC: 4.64 MIL/uL (ref 4.22–5.81)
RDW: 12.2 % (ref 11.5–15.5)
WBC: 6.2 10*3/uL (ref 4.0–10.5)
nRBC: 0 % (ref 0.0–0.2)

## 2021-02-23 NOTE — ED Notes (Signed)
Pt called x 3, no response, moving OTF.

## 2021-02-23 NOTE — ED Provider Notes (Addendum)
Emergency Medicine Provider Triage Evaluation Note  Ernest Mathews. , a 35 y.o. male  was evaluated in triage.  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  Reports pain to posterior left lower leg and knee..  Review of Systems  Positive: Leg pain, swelling Negative: Shortness of breath  Physical Exam  BP 124/78 (BP Location: Left Arm)   Pulse (!) 56   Temp 98.6 F (37 C) (Oral)   Resp 18   SpO2 97%  Gen:   Awake, no distress   Resp:  Normal effort  MSK:   Moves extremities without difficulty  Other:  Swelling and tenderness to left lower leg with mild erythema.  Medical Decision Making  Medically screening exam initiated at 8:33 PM.  Appropriate orders placed.  Ernest Mathews. was informed that the remainder of the evaluation will be completed by another provider, this initial triage assessment does not replace that evaluation, and the importance of remaining in the ED until their evaluation is complete.     Jeannie Fend, PA-C 02/23/21 2033    Jeannie Fend, PA-C 02/23/21 2034    Benjiman Core, MD 02/23/21 [phone]

## 2021-02-23 NOTE — ED Notes (Signed)
Pt called for vitals x2. No response.

## 2021-02-23 NOTE — ED Triage Notes (Signed)
Patient reports L leg/calf pain, hx of DVT in that leg, reports he was on xarelto but was taken off

## 2021-02-24 ENCOUNTER — Emergency Department (HOSPITAL_BASED_OUTPATIENT_CLINIC_OR_DEPARTMENT_OTHER)
Admission: EM | Admit: 2021-02-24 | Discharge: 2021-02-24 | Disposition: A | Discharge status: Home / Self Care | Payer: Self-pay | Attending: Emergency Medicine | Admitting: Emergency Medicine

## 2021-02-24 ENCOUNTER — Other Ambulatory Visit: Payer: Self-pay

## 2021-02-24 ENCOUNTER — Encounter (HOSPITAL_BASED_OUTPATIENT_CLINIC_OR_DEPARTMENT_OTHER): Payer: Self-pay | Admitting: Emergency Medicine

## 2021-02-24 ENCOUNTER — Emergency Department (HOSPITAL_BASED_OUTPATIENT_CLINIC_OR_DEPARTMENT_OTHER): Payer: Self-pay

## 2021-02-24 DIAGNOSIS — I82812 Embolism and thrombosis of superficial veins of left lower extremities: Secondary | ICD-10-CM | POA: Insufficient documentation

## 2021-02-24 DIAGNOSIS — I82890 Acute embolism and thrombosis of other specified veins: Secondary | ICD-10-CM

## 2021-02-24 DIAGNOSIS — Z87891 Personal history of nicotine dependence: Secondary | ICD-10-CM | POA: Insufficient documentation

## 2021-02-24 DIAGNOSIS — Z7901 Long term (current) use of anticoagulants: Secondary | ICD-10-CM | POA: Insufficient documentation

## 2021-02-24 HISTORY — DX: Acute embolism and thrombosis of unspecified vein: I82.90

## 2021-02-24 MED ORDER — APIXABAN 2.5 MG PO TABS: 2.5000 mg | ORAL_TABLET | Freq: Two times a day (BID) | ORAL | 0 refills | Status: DC

## 2021-02-24 NOTE — Discharge Instructions (Signed)
We saw in the ER for leg swelling.  You have superficial vein thrombosis, given that it is large, you will need blood thinners for 45 days.  Please take the medications prescribed and follow-up with your primary care doctor in 1 week. If your father does have history of clotting disorder, consider seeing hematologist for optimal diagnosis and prevention of clots.  Return to the ER if you start having worsening in the leg swelling, chest pain, shortness of breath, fevers or chills.

## 2021-02-24 NOTE — ED Notes (Signed)
Patient transported to Ultrasound 

## 2021-02-24 NOTE — ED Triage Notes (Signed)
Has history of blood clots in legs. pts father has history of blood clots and clotting disorder. Pt presents with 4 days left leg calf swelling, red, and painful.

## 2021-02-24 NOTE — ED Provider Notes (Signed)
[facility] EMERGENCY DEPT Provider Note   CSN: [id number] Arrival date & time: 02/24/21  2542     History Chief Complaint  Patient presents with  . Leg Pain    Ernest Mathews. is a 35 y.o. male.  HPI    35 year old male with history of DVT comes in with chief complaint of left leg pain.  Patient started having pain in his left leg few days back.  He is having discomfort in the leg without any chest pain, shortness of breath.  Symptoms are similar to when he had blood clot a couple of years back, at which time he was on Xarelto.  He does indicate that there is some clotting disorder that his father has for which he is on long-term Coumadin, but he is unsure of the diagnosis.  When patient had DVT, he is not sure if there was any work-up done to see if he has any genetic condition predisposing him to clotting.  [name] denies any recent travels, exogenous testosterone or estrogen use, recent surgeries.  Past Medical History:  Diagnosis Date  . Allergic rhinitis, cause unspecified 03/10/2013  . ANXIETY 10/14/2007   Qualifier: Diagnosis of  By: Jonny Ruiz MD, Len Blalock   . Blood clot in vein   . COMMON MIGRAINE 10/14/2007   Qualifier: Diagnosis of  By: Jonny Ruiz MD, Len Blalock   . DEPRESSION 10/14/2007   Qualifier: Diagnosis of  By: Jonny Ruiz MD, Len Blalock   . GERD 10/14/2007   Qualifier: Diagnosis of  By: Jonny Ruiz MD, Len Blalock   . Lumbar disc disease 10/14/2013    Patient Active Problem List   Diagnosis Date Noted  . OSA (obstructive sleep apnea) 06/29/2020  . Insomnia 06/29/2020  . DVT of popliteal vein (HCC) 10/28/2018  . Preventative health care 10/14/2013  . Lumbar disc disease 10/14/2013  . Right ureteral stone 10/14/2013  . Low back pain 08/16/2013  . Allergic rhinitis 03/10/2013  . Erectile dysfunction 07/21/2012  . Anxiety state 10/14/2007  . Depression 10/14/2007  . Migraine without aura 10/14/2007  . GERD 10/14/2007    Past Surgical History:  Procedure Laterality Date  .  BACK SURGERY    . foot surgury    . HAND SURGERY         Family History  Problem Relation Age of Onset  . Hypertension Mother   . Migraines Mother   . Depression Father     Social History   Tobacco Use  . Smoking status: Former Smoker    Types: E-cigarettes  . Smokeless tobacco: Never Used  . Tobacco comment: VAPE PEN  Vaping Use  . Vaping Use: Every day  Substance Use Topics  . Alcohol use: Yes  . Drug use: No    Home Medications Prior to Admission medications   Medication Sig Start Date End Date Taking? Authorizing Provider  apixaban (ELIQUIS) 2.5 MG TABS tablet Take 1 tablet (2.5 mg total) by mouth 2 (two) times daily. 02/24/21 04/10/21 Yes Derwood Kaplan, MD  clonazePAM (KLONOPIN) 0.5 MG tablet Take 1 tablet (0.5 mg total) by mouth 2 (two) times daily as needed. for anxiety 03/15/15   Corwin Levins, MD  FLUoxetine (PROZAC) 40 MG capsule Take 1 capsule (40 mg total) by mouth daily. 03/15/15   Corwin Levins, MD  lidocaine (LIDODERM) 5 % Place 1 patch onto the skin daily. Remove & Discard patch within 12 hours or as directed by MD 12/26/19   Harolyn Rutherford C, PA-C  methocarbamol (  ROBAXIN) 750 MG tablet Take 1 tablet (750 mg total) by mouth 2 (two) times daily as needed for muscle spasms (or muscle tightness). 12/26/19   Joy, Shawn C, PA-C  omeprazole (PRILOSEC) 20 MG capsule Take 1 capsule (20 mg total) by mouth daily. 03/15/15   Corwin Levins, MD  SUMAtriptan (IMITREX) 100 MG tablet Take 1 tablet (100 mg total) by mouth every 2 (two) hours as needed for migraine. May repeat in 2 hours if headache persists or recurs. 03/15/15   Corwin Levins, MD    Allergies    Cefaclor  Review of Systems   Review of Systems  Constitutional: Positive for activity change. Negative for fever.  Musculoskeletal: Positive for myalgias.  Skin: Positive for rash.  Hematological: Does not bruise/bleed easily.    Physical Exam Updated Vital Signs BP 130/86 (BP Location: Left Arm)   Pulse (!) 53   Temp  98.1 F (36.7 C) (Oral)   Resp 16   Ht 5\' 10"  (1.778 m)   Wt 99.8 kg   SpO2 97%   BMI 31.57 kg/m   Physical Exam Vitals and nursing note reviewed.  Constitutional:      Appearance: He is well-developed.  HENT:     Head: Atraumatic.  Cardiovascular:     Rate and Rhythm: Normal rate.  Pulmonary:     Effort: Pulmonary effort is normal.  Musculoskeletal:        General: Swelling and tenderness present.     Cervical back: Neck supple.     Comments: Patient has erythematous left lower extremity with warmth to touch.  The erythema is over the distal third of the leg posteriorly.  There is tenderness to palpation of the calf region.  No gross significant edema noted.  Skin:    General: Skin is warm.  Neurological:     Mental Status: He is alert and oriented to person, place, and time.     ED Results / Procedures / Treatments   Labs (all labs ordered are listed, but only abnormal results are displayed) Labs Reviewed - No data to display  EKG None  Radiology Venous Img Lower Unilateral Left  Result Date: 02/24/2021 CLINICAL DATA:  Left lower extremity swelling.  Pain.  Erythema. EXAM: Left LOWER EXTREMITY VENOUS DOPPLER ULTRASOUND TECHNIQUE: Gray-scale sonography with compression, as well as color and duplex ultrasound, were performed to evaluate the deep venous system(s) from the level of the common femoral vein through the popliteal and proximal calf veins. COMPARISON:  06/28/2019 and 10/25/2018 FINDINGS: VENOUS Normal compressibility of the common femoral, superficial femoral, and popliteal veins, as well as the visualized calf veins. Visualized portions of profunda femoral vein and great saphenous vein unremarkable. No filling defects to suggest DVT on grayscale or color Doppler imaging. Doppler waveforms show normal direction of venous flow, normal respiratory plasticity and response to augmentation. Limited views of the contralateral common femoral vein are unremarkable. OTHER  The greater saphenous vein demonstrates noncompressible thrombus. This is present from the mid calf to the ankle. Limitations: none IMPRESSION: 1. No deep venous thrombosis. 2. Superficial thrombus within the greater saphenous vein. Electronically Signed   By: 10/27/2018 M.D.   On: 02/24/2021 12:12    Procedures Procedures   Medications Ordered in ED Medications - No data to display  ED Course  I have reviewed the triage vital signs and the nursing notes.  Pertinent labs & imaging results that were available during my care of the patient were reviewed by  me and considered in my medical decision making (see chart for details).    MDM Rules/Calculators/A&P                          35 year old male with history of DVT comes in a chief complaint of left lower extremity swelling.  Ultrasound DVT ordered. Differential diagnosis includes DVT versus superficial thrombophlebitis versus cellulitis.  12:29 PM Patient has superficial thrombophlebitis and greater saphenous vein.  It appears that it is in an area that is larger than 5 cm.  We will start him on prophylactic 45-day course of anticoagulation and advised PCP follow-up.  Patient is taken anticoagulation in the past and is comfortable with it.  He has been advised to get further information on clotting disorder from his father, if it is something genetic then we have advised him to mention that with his PCP or see the hematologist.  Final Clinical Impression(s) / ED Diagnoses Final diagnoses:  Superficial vein thrombosis    Rx / DC Orders ED Discharge Orders         Ordered    apixaban (ELIQUIS) 2.5 MG TABS tablet  2 times daily        02/24/21 1225           Derwood Kaplan, MD 02/24/21 1229

## 2021-05-21 ENCOUNTER — Encounter (HOSPITAL_BASED_OUTPATIENT_CLINIC_OR_DEPARTMENT_OTHER): Payer: Self-pay

## 2021-05-21 ENCOUNTER — Emergency Department (HOSPITAL_BASED_OUTPATIENT_CLINIC_OR_DEPARTMENT_OTHER)
Admission: EM | Admit: 2021-05-21 | Discharge: 2021-05-21 | Disposition: A | Discharge status: Home / Self Care | Payer: Self-pay | Attending: Emergency Medicine | Admitting: Emergency Medicine

## 2021-05-21 ENCOUNTER — Emergency Department (HOSPITAL_BASED_OUTPATIENT_CLINIC_OR_DEPARTMENT_OTHER): Payer: Self-pay | Admitting: Radiology

## 2021-05-21 ENCOUNTER — Other Ambulatory Visit: Payer: Self-pay

## 2021-05-21 DIAGNOSIS — Z87891 Personal history of nicotine dependence: Secondary | ICD-10-CM | POA: Insufficient documentation

## 2021-05-21 DIAGNOSIS — X58XXXA Exposure to other specified factors, initial encounter: Secondary | ICD-10-CM | POA: Insufficient documentation

## 2021-05-21 DIAGNOSIS — M25511 Pain in right shoulder: Secondary | ICD-10-CM | POA: Insufficient documentation

## 2021-05-21 MED ORDER — NAPROXEN 250 MG PO TABS
500.0000 mg | ORAL_TABLET | Freq: Once | ORAL | Status: AC
  Administered 2021-05-21: 500 mg via ORAL
  Filled 2021-05-21: qty 2

## 2021-05-21 MED ORDER — METHOCARBAMOL 500 MG PO TABS: 500.0000 mg | ORAL_TABLET | Freq: Three times a day (TID) | ORAL | 0 refills | Status: DC | PRN

## 2021-05-21 NOTE — Discharge Instructions (Addendum)
You were evaluated in the Emergency Department and after careful evaluation, we did not find any emergent condition requiring admission or further testing in the hospital.  Your exam/testing today was overall reassuring.  Symptoms seem to be due to muscle or rotator cuff soreness or sprain from the increased activity.  Recommend Tylenol 1000 mg every 4-6 hours for pain.  Can use the Robaxin muscle relaxer for more significant pain.  Use the sling for support but please range the shoulder multiple times a day to prevent frozen shoulder.  If not improved significantly in 2 weeks, recommend follow-up with an orthopedic specialist.  Please return to the Emergency Department if you experience any worsening of your condition.  Thank you for allowing [place] to be a part of your care.

## 2021-05-21 NOTE — ED Triage Notes (Signed)
Pt reports injuring right shoulder last Tuesday while throwing football with his son at the beach.  Has tried icy hot, sling, ice, massage, and Ibuprofen with no relief.  There is a knot on the right posterior shoulder.

## 2021-05-21 NOTE — ED Provider Notes (Signed)
[facility]GENC[facility] Emergency Department Provider Note MRN:  [id number]  Arrival date & time: 05/21/21     Chief Complaint   Shoulder Injury   History of Present Illness   Ernest Cleckler. is a 35 y.o. year-old male with a history of DVT on Eliquis presenting to the ED with chief complaint of shoulder injury.  Patient was at the beach with his [place] and was throwing the football with his children for 45 minutes.  Did not have any trauma or acute pain during that time.  Today feeling worsening soreness in the right shoulder and now having trouble moving it without any pain.  Denies any fever, no chest pain, no other complaints.  Pain is moderate, constant, worse with motion or palpation.  Review of Systems  A problem-focused ROS was performed. Positive for shoulder pain.  Patient denies fever.  Patient's Health History    Past Medical History:  Diagnosis Date   Allergic rhinitis, cause unspecified 03/10/2013   ANXIETY 10/14/2007   Qualifier: Diagnosis of  By: Jonny Ruiz MD, Len Blalock    Blood clot in vein    COMMON MIGRAINE 10/14/2007   Qualifier: Diagnosis of  By: Jonny Ruiz MD, Len Blalock    DEPRESSION 10/14/2007   Qualifier: Diagnosis of  By: Jonny Ruiz MD, Len Blalock    GERD 10/14/2007   Qualifier: Diagnosis of  By: Jonny Ruiz MD, Len Blalock    Lumbar disc disease 10/14/2013    Past Surgical History:  Procedure Laterality Date   BACK SURGERY     foot surgury     HAND SURGERY      Family History  Problem Relation Age of Onset   Hypertension Mother    Migraines Mother    Depression Father     Social History   Socioeconomic History   Marital status: Married    Spouse name: Not on file   Number of children: Not on file   Years of education: Not on file   Highest education level: Not on file  Occupational History   Not on file  Tobacco Use   Smoking status: Former    Types: E-cigarettes   Smokeless tobacco: Never   Tobacco comments:    VAPE PEN  Vaping Use   Vaping Use: Every day   Substance and Sexual Activity   Alcohol use: Yes    Comment: 12 pack per week   Drug use: No   Sexual activity: Not on file  Other Topics Concern   Not on file  Social History Narrative   Not on file   Social Determinants of Health   Financial Resource Strain: Not on file  Food Insecurity: Not on file  Transportation Needs: Not on file  Physical Activity: Not on file  Stress: Not on file  Social Connections: Not on file  Intimate Partner Violence: Not on file     Physical Exam   Vitals:   05/21/21 1753 05/21/21 2301  BP: 123/90 (!) 132/94  Pulse: 70 (!) 58  Resp: 16 16  Temp: 98.3 F (36.8 C)   SpO2: 97% 100%    CONSTITUTIONAL: Well-appearing, NAD NEURO:  Alert and oriented x 3, no focal deficits EYES:  eyes equal and reactive ENT/NECK:  no LAD, no JVD CARDIO: Regular rate, well-perfused, normal S1 and S2 PULM:  CTAB no wheezing or rhonchi GI/GU:  normal bowel sounds, non-distended, non-tender MSK/SPINE:  No gross deformities, no edema, reduced range of motion of the right shoulder due to pain, neurovascularly  intact right upper extremity SKIN: Nontender 3 cm circular nodule to the right posterior shoulder PSYCH:  Appropriate speech and behavior  *Additional and/or pertinent findings included in MDM below  Diagnostic and Interventional Summary    EKG Interpretation  Date/Time:    Ventricular Rate:    PR Interval:    QRS Duration:   QT Interval:    QTC Calculation:   R Axis:     Text Interpretation:         Labs Reviewed - No data to display  DG Shoulder Right  Final Result      Medications  naproxen (NAPROSYN) tablet 500 mg (has no administration in time range)     Procedures  /  Critical Care Procedures  ED Course and Medical Decision Making  I have reviewed the triage vital signs, the nursing notes, and pertinent available records from the EMR.  Listed above are laboratory and imaging tests that I personally ordered, reviewed, and  interpreted and then considered in my medical decision making (see below for details).  History and exam is consistent with a rotator cuff strain or sprain.  Has largely preserved range of motion, no loss of function, neurovascularly intact, no increased warmth or erythema, doubt septic joint.  There is a small nodule on his shoulder blade that on exam seems most consistent with a lipoma, advised to keep a close eye on it and if getting tender or painful or enlarging recommend repeat evaluation.  Otherwise patient appropriate for discharge with conservative management.       Elmer Sow. Pilar Plate, MD [facility] Emergency Medicine [facility] [email or web address]  Final Clinical Impressions(s) / ED Diagnoses     ICD-10-CM   1. Acute pain of right shoulder  M25.511       ED Discharge Orders          Ordered    methocarbamol (ROBAXIN) 500 MG tablet  Every 8 hours PRN        05/21/21 2325             Discharge Instructions Discussed with and Provided to Patient:    Discharge Instructions      You were evaluated in the Emergency Department and after careful evaluation, we did not find any emergent condition requiring admission or further testing in the hospital.  Your exam/testing today was overall reassuring.  Symptoms seem to be due to muscle or rotator cuff soreness or sprain from the increased activity.  Recommend Tylenol 1000 mg every 4-6 hours for pain.  Can use the Robaxin muscle relaxer for more significant pain.  Use the sling for support but please range the shoulder multiple times a day to prevent frozen shoulder.  If not improved significantly in 2 weeks, recommend follow-up with an orthopedic specialist.  Please return to the Emergency Department if you experience any worsening of your condition.  Thank you for allowing [place] to be a part of your care.        Sabas Sous, MD 05/21/21 2329

## 2021-07-03 DIAGNOSIS — M25511 Pain in right shoulder: Secondary | ICD-10-CM | POA: Diagnosis not present

## 2021-07-03 DIAGNOSIS — M7531 Calcific tendinitis of right shoulder: Secondary | ICD-10-CM | POA: Diagnosis not present

## 2021-07-03 DIAGNOSIS — D1721 Benign lipomatous neoplasm of skin and subcutaneous tissue of right arm: Secondary | ICD-10-CM | POA: Diagnosis not present

## 2021-07-03 DIAGNOSIS — M13819 Other specified arthritis, unspecified shoulder: Secondary | ICD-10-CM | POA: Diagnosis not present

## 2021-07-30 DIAGNOSIS — M7551 Bursitis of right shoulder: Secondary | ICD-10-CM | POA: Diagnosis not present

## 2021-07-30 DIAGNOSIS — M7531 Calcific tendinitis of right shoulder: Secondary | ICD-10-CM | POA: Diagnosis not present

## 2021-07-30 DIAGNOSIS — M19011 Primary osteoarthritis, right shoulder: Secondary | ICD-10-CM | POA: Diagnosis not present

## 2021-07-30 DIAGNOSIS — M13811 Other specified arthritis, right shoulder: Secondary | ICD-10-CM | POA: Diagnosis not present

## 2021-07-30 DIAGNOSIS — D1721 Benign lipomatous neoplasm of skin and subcutaneous tissue of right arm: Secondary | ICD-10-CM | POA: Diagnosis not present

## 2021-07-30 DIAGNOSIS — M7541 Impingement syndrome of right shoulder: Secondary | ICD-10-CM | POA: Diagnosis not present

## 2021-07-30 DIAGNOSIS — G8918 Other acute postprocedural pain: Secondary | ICD-10-CM | POA: Diagnosis not present

## 2022-02-21 ENCOUNTER — Ambulatory Visit: Payer: BC Managed Care – PPO | Admitting: Nurse Practitioner

## 2022-02-21 ENCOUNTER — Encounter: Payer: Self-pay | Admitting: Nurse Practitioner

## 2022-02-21 VITALS — BP 112/65 | HR 53 | Temp 98.2°F | Ht 68.9 in | Wt 207.8 lb

## 2022-02-21 DIAGNOSIS — G43909 Migraine, unspecified, not intractable, without status migrainosus: Secondary | ICD-10-CM | POA: Diagnosis not present

## 2022-02-21 DIAGNOSIS — M79604 Pain in right leg: Secondary | ICD-10-CM | POA: Diagnosis not present

## 2022-02-21 DIAGNOSIS — K219 Gastro-esophageal reflux disease without esophagitis: Secondary | ICD-10-CM

## 2022-02-21 DIAGNOSIS — Z86718 Personal history of other venous thrombosis and embolism: Secondary | ICD-10-CM | POA: Diagnosis not present

## 2022-02-21 DIAGNOSIS — Z7689 Persons encountering health services in other specified circumstances: Secondary | ICD-10-CM

## 2022-02-21 DIAGNOSIS — F411 Generalized anxiety disorder: Secondary | ICD-10-CM | POA: Diagnosis not present

## 2022-02-21 MED ORDER — ALPRAZOLAM 0.5 MG PO TABS: 0.5000 mg | ORAL_TABLET | Freq: Every day | ORAL | 1 refills | Status: DC | PRN

## 2022-02-21 MED ORDER — OMEPRAZOLE 40 MG PO CPDR: 40.0000 mg | DELAYED_RELEASE_CAPSULE | Freq: Every day | ORAL | 3 refills | Status: DC

## 2022-02-21 MED ORDER — FLUOXETINE HCL 40 MG PO CAPS: 40.0000 mg | ORAL_CAPSULE | Freq: Every day | ORAL | 3 refills | Status: DC

## 2022-02-21 MED ORDER — SUMATRIPTAN SUCCINATE 100 MG PO TABS: 100.0000 mg | ORAL_TABLET | ORAL | 5 refills | Status: DC | PRN

## 2022-02-21 NOTE — Progress Notes (Signed)
New Patient Office Visit  Subjective    Patient ID: Ernest Mathews., male    DOB: 1986-06-26  Age: 36 y.o. MRN: [id number]  CC:  Chief Complaint  Patient presents with   New Patient (Initial Visit)    HPI Ernest Mathews. presents to establish care The patient coming from Dr. Clarene Duke, another local provider.  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 Has been off and on blood thinners for a few years. States that he did see vascular provider who took him off eliquis.  -today, he states that he started having pain in right lower extremity. There is also swelling present. States this feels very similar to prior clots he has had.  -denies shortness of breath. Does have history of vaping.  -has history of anxiety/depression.  --taking fluoxetine 40mg  daily --should be on alprazolam 0.5mg  twice daily as needed  --needs to have refills    Outpatient Encounter Medications as of 02/21/2022  Medication Sig   ALPRAZolam (XANAX) 0.5 MG tablet Take 1 tablet (0.5 mg total) by mouth daily as needed for anxiety.   [DISCONTINUED] FLUoxetine (PROZAC) 40 MG capsule Take 1 capsule (40 mg total) by mouth daily.   [DISCONTINUED] omeprazole (PRILOSEC) 20 MG capsule Take 1 capsule (20 mg total) by mouth daily. (Patient taking differently: Take 40 mg by mouth daily.)   [DISCONTINUED] SUMAtriptan (IMITREX) 100 MG tablet Take 1 tablet (100 mg total) by mouth every 2 (two) hours as needed for migraine. May repeat in 2 hours if headache persists or recurs.   apixaban (ELIQUIS) 2.5 MG TABS tablet Take 1 tablet (2.5 mg total) by mouth 2 (two) times daily.   FLUoxetine (PROZAC) 40 MG capsule Take 1 capsule (40 mg total) by mouth daily.   omeprazole (PRILOSEC) 40 MG capsule Take 1 capsule (40 mg total) by mouth daily.   SUMAtriptan (IMITREX) 100 MG tablet Take 1 tablet (100 mg total) by mouth every 2 (two) hours as needed for migraine. May repeat in 2 hours if headache  persists or recurs.   [DISCONTINUED] clonazePAM (KLONOPIN) 0.5 MG tablet Take 1 tablet (0.5 mg total) by mouth 2 (two) times daily as needed. for anxiety   [DISCONTINUED] lidocaine (LIDODERM) 5 % Place 1 patch onto the skin daily. Remove & Discard patch within 12 hours or as directed by MD   [DISCONTINUED] methocarbamol (ROBAXIN) 500 MG tablet Take 1 tablet (500 mg total) by mouth every 8 (eight) hours as needed for muscle spasms.   No facility-administered encounter medications on file as of 02/21/2022.    Past Medical History:  Diagnosis Date   Allergic rhinitis, cause unspecified 03/10/2013   ANXIETY 10/14/2007   Qualifier: Diagnosis of  By: 12/12/2007 MD, Jonny Ruiz    Blood clot in vein    COMMON MIGRAINE 10/14/2007   Qualifier: Diagnosis of  By: 12/12/2007 MD, Jonny Ruiz    DEPRESSION 10/14/2007   Qualifier: Diagnosis of  By: 12/12/2007 MD, Jonny Ruiz    GERD 10/14/2007   Qualifier: Diagnosis of  By: 12/12/2007 MD, Jonny Ruiz    Lumbar disc disease 10/14/2013    Past Surgical History:  Procedure Laterality Date   BACK SURGERY     foot surgury     HAND SURGERY     SHOULDER SURGERY Right     Family History  Problem Relation Age of Onset   Hypertension Mother    Migraines Mother    Depression Father  Social History   Socioeconomic History   Marital status: Married    Spouse name: Not on file   Number of children: Not on file   Years of education: Not on file   Highest education level: Not on file  Occupational History   Not on file  Tobacco Use   Smoking status: Former    Types: E-cigarettes   Smokeless tobacco: Never   Tobacco comments:    VAPE PEN  Vaping Use   Vaping Use: Every day  Substance and Sexual Activity   Alcohol use: Yes    Comment: 12 pack per week   Drug use: No   Sexual activity: Yes  Other Topics Concern   Not on file  Social History Narrative   Not on file   Social Determinants of Health   Financial Resource Strain: Not on file  Food Insecurity: Not on file   Transportation Needs: Not on file  Physical Activity: Not on file  Stress: Not on file  Social Connections: Not on file  Intimate Partner Violence: Not on file    Review of Systems  Constitutional:  Positive for malaise/fatigue. Negative for chills and fever.  HENT:  Negative for congestion, sinus pain and sore throat.   Eyes: Negative.   Respiratory:  Positive for shortness of breath. Negative for cough and wheezing.   Cardiovascular:  Positive for claudication and leg swelling. Negative for chest pain and palpitations.       Leg pain with significant history of DVT and superficial thrombus.  Gastrointestinal:  Negative for constipation, diarrhea, nausea and vomiting.  Genitourinary: Negative.   Musculoskeletal:  Negative for myalgias.  Skin: Negative.   Neurological:  Positive for headaches. Negative for dizziness.  Endo/Heme/Allergies:  Does not bruise/bleed easily.  Psychiatric/Behavioral:  Negative for depression. The patient is nervous/anxious.        Objective    Today's Vitals   02/21/22 1610  BP: 112/65  Pulse: (!) 53  Temp: 98.2 F (36.8 C)  SpO2: 100%  Weight: 207 lb 12.8 oz (94.3 kg)  Height: 5' 8.9" (1.75 m)   Body mass index is 30.78 kg/m.   Physical Exam Vitals and nursing note reviewed.  Constitutional:      Appearance: Normal appearance. He is well-developed.  HENT:     Head: Normocephalic and atraumatic.     Nose: Nose normal.     Mouth/Throat:     Mouth: Mucous membranes are moist.     Pharynx: Oropharynx is clear.  Eyes:     Extraocular Movements: Extraocular movements intact.     Conjunctiva/sclera: Conjunctivae normal.     Pupils: Pupils are equal, round, and reactive to light.  Cardiovascular:     Rate and Rhythm: Normal rate and regular rhythm.     Pulses: Normal pulses.     Heart sounds: Normal heart sounds.     Comments: Swelling, warmth, and tenderness present to right lower extremity from the ankle up to the calf.  There is  delayed capillary refill in right foot.  Pulses palpable but diminished. Pulmonary:     Effort: Pulmonary effort is normal.     Breath sounds: Normal breath sounds.  Abdominal:     Palpations: Abdomen is soft.  Musculoskeletal:        General: Normal range of motion.     Cervical back: Normal range of motion and neck supple.  Lymphadenopathy:     Cervical: No cervical adenopathy.  Skin:    General: Skin is warm  and dry.     Capillary Refill: Capillary refill takes less than 2 seconds.  Neurological:     General: No focal deficit present.     Mental Status: He is alert and oriented to person, place, and time.  Psychiatric:        Attention and Perception: Attention and perception normal.        Mood and Affect: Affect normal. Mood is anxious.        Speech: Speech normal.        Behavior: Behavior normal. Behavior is cooperative.        Thought Content: Thought content normal.        Cognition and Memory: Cognition and memory normal.        Judgment: Judgment normal.    Assessment & Plan:  1. Pain of right lower extremity Pain of right lower extremity which is accompanied by swelling, redness, and warmth.  He does have history of DVT in left lower extremity.  Currently not on blood thinners.  Recommend he be seen in ER for further evaluation.  Will need referral to pain vascular provider.  2. History of DVT (deep vein thrombosis) Patient not currently taking blood thinners.  Having pain with swelling in the right lower extremity.  Recommend evaluation in ER.  Consider referral to vein and vascular at next visit.  3. Acute migraine Patient may take Imitrex as needed for acute migraines.  New prescription sent to his pharmacy. - SUMAtriptan (IMITREX) 100 MG tablet; Take 1 tablet (100 mg total) by mouth every 2 (two) hours as needed for migraine. May repeat in 2 hours if headache persists or recurs.  Dispense: 10 tablet; Refill: 5  4. Generalized anxiety disorder Patient should  continue fluoxetine 40 mg daily.  He may take alprazolam 0.5 mg daily as needed for acute anxiety.  This dosing is decreased from twice daily today.  Need to get controlled substances agreement signed at next visit. - FLUoxetine (PROZAC) 40 MG capsule; Take 1 capsule (40 mg total) by mouth daily.  Dispense: 90 capsule; Refill: 3 - ALPRAZolam (XANAX) 0.5 MG tablet; Take 1 tablet (0.5 mg total) by mouth daily as needed for anxiety.  Dispense: 30 tablet; Refill: 1  5. Gastroesophageal reflux disease without esophagitis May take omeprazole 40 mg daily. - omeprazole (PRILOSEC) 40 MG capsule; Take 1 capsule (40 mg total) by mouth daily.  Dispense: 30 capsule; Refill: 3  6. Encounter to establish care Appointment today to establish new primary care provider      Problem List Items Addressed This Visit       Cardiovascular and Mediastinum   Acute migraine   Relevant Medications   FLUoxetine (PROZAC) 40 MG capsule   SUMAtriptan (IMITREX) 100 MG tablet     Digestive   GERD   Relevant Medications   omeprazole (PRILOSEC) 40 MG capsule     Other   Generalized anxiety disorder   Relevant Medications   FLUoxetine (PROZAC) 40 MG capsule   ALPRAZolam (XANAX) 0.5 MG tablet   Pain of right lower extremity - Primary   History of DVT (deep vein thrombosis)   Other Visit Diagnoses     Encounter to establish care           Return in about 2 weeks (around 03/07/2022) for blood clots , FBW at time of visit.   Carlean Jews, NP

## 2022-02-26 ENCOUNTER — Emergency Department
Admission: EM | Admit: 2022-02-26 | Discharge: 2022-02-26 | Disposition: A | Discharge status: Home / Self Care | Payer: BC Managed Care – PPO | Attending: Emergency Medicine | Admitting: Emergency Medicine

## 2022-02-26 ENCOUNTER — Emergency Department: Payer: BC Managed Care – PPO

## 2022-02-26 ENCOUNTER — Encounter: Payer: Self-pay | Admitting: Emergency Medicine

## 2022-02-26 ENCOUNTER — Other Ambulatory Visit: Payer: Self-pay

## 2022-02-26 DIAGNOSIS — Z7901 Long term (current) use of anticoagulants: Secondary | ICD-10-CM | POA: Diagnosis not present

## 2022-02-26 DIAGNOSIS — M25562 Pain in left knee: Secondary | ICD-10-CM | POA: Diagnosis not present

## 2022-02-26 DIAGNOSIS — M79605 Pain in left leg: Secondary | ICD-10-CM | POA: Diagnosis not present

## 2022-02-26 DIAGNOSIS — M25561 Pain in right knee: Secondary | ICD-10-CM | POA: Diagnosis not present

## 2022-02-26 DIAGNOSIS — M7989 Other specified soft tissue disorders: Secondary | ICD-10-CM | POA: Diagnosis not present

## 2022-02-26 DIAGNOSIS — M79604 Pain in right leg: Secondary | ICD-10-CM | POA: Diagnosis not present

## 2022-02-26 MED ORDER — MELOXICAM 15 MG PO TABS: 15.0000 mg | ORAL_TABLET | Freq: Every day | ORAL | 0 refills | Status: DC

## 2022-02-26 NOTE — ED Triage Notes (Signed)
Pt to ED from home c/o possible blood clots to bilateral legs.  States discoloration, swelling, pain to posterior leg for last 3 weeks.  Hx of blood clots and told to come to ED for evaluation before starting meds from PCP.  Denies taking blood thinners.  Legs appear appropriate color and size at this time, chest rise even and unlabored, A&Ox4, in NAD at this time.   Apolinar Junes, [place] in triage for [facility] and placed orders.

## 2022-02-26 NOTE — ED Provider Triage Note (Signed)
  Emergency Medicine Provider Triage Evaluation Note  Carolyne Littles. , a 36 y.o.male,  was evaluated in triage.  Pt complains of bilateral leg swelling.  Reports history of blood clots in both of his legs.  He states that he is currently endorsing pain behind the left knee and the right calf.  Been ongoing for the past 3 weeks.   Review of Systems  Positive: Bilateral leg pain/swelling Negative: Denies fever, chest pain, vomiting  Physical Exam  There were no vitals filed for this visit. Gen:   Awake, no distress   Resp:  Normal effort  MSK:   Moves extremities without difficulty  Other:  Pain appreciated on the popliteal region of the left lower extremity.  Right calf pain appreciated well.  Medical Decision Making  Given the patient's initial medical screening exam, the following diagnostic evaluation has been ordered. The patient will be placed in the appropriate treatment space, once one is available, to complete the evaluation and treatment. I have discussed the plan of care with the patient and I have advised the patient that an ED physician or mid-level practitioner will reevaluate their condition after the test results have been received, as the results may give them additional insight into the type of treatment they may need.    Diagnostics: Right/left lower extremity ultrasounds.  Treatments: none immediately   Varney Daily, [place] 02/26/22 1829

## 2022-02-26 NOTE — ED Provider Notes (Signed)
[facility] Provider Note  Patient Contact: 8:37 PM (approximate)   History   Leg Swelling   HPI  Ernest Mathews. is a 36 y.o. male who presents the emergency department complaining of bilateral posterior knee pain.  Patient states that the pain is intermittent, feels like an ache.  He is concerned as he does have a history of DVT, has had 2 DVTs.  Patient has been on 3 months worth of anticoagulation each time, been removed.  Last DVT was over a year ago.  Patient saw primary care, mentioned the leg pain and and was advised to follow-up in the ED for ultrasound.     Physical Exam   Triage Vital Signs: ED Triage Vitals  Enc Vitals Group     BP 02/26/22 1943 116/84     Pulse Rate 02/26/22 1943 61     Resp 02/26/22 1943 18     Temp 02/26/22 1943 98.5 F (36.9 C)     Temp Source 02/26/22 1943 Oral     SpO2 02/26/22 1943 99 %     Weight 02/26/22 1827 200 lb (90.7 kg)     Height 02/26/22 1827 5\' 10"  (1.778 m)     Head Circumference --      Peak Flow --      Pain Score 02/26/22 1827 3     Pain Loc --      Pain Edu? --      Excl. in GC? --     Most recent vital signs: Vitals:   02/26/22 1943  BP: 116/84  Pulse: 61  Resp: 18  Temp: 98.5 F (36.9 C)  SpO2: 99%     General: Alert and in no acute distress.  Cardiovascular:  Good peripheral perfusion Respiratory: Normal respiratory effort without tachypnea or retractions. Lungs CTAB.  Musculoskeletal: Full range of motion to all extremities.  No gross erythema or edema of either lower extremity is appreciated.  Patient is mildly tender to palpation in the popliteal fossa bilaterally without palpable abnormality.  Dorsalis pedis pulses sensation intact distally. Neurologic:  No gross focal neurologic deficits are appreciated.  Skin:   No rash noted Other:   ED Results / Procedures / Treatments   Labs (all labs ordered are listed, but only abnormal results are displayed) Labs Reviewed - No  data to display   EKG     RADIOLOGY  I personally viewed, evaluated, and interpreted these images as part of my medical decision making, as well as reviewing the written report by the radiologist.  ED Provider Interpretation: No evidence of DVT bilaterally  02/28/22 Venous Img Lower Bilateral (DVT)  Result Date: 02/26/2022 CLINICAL DATA:  Bilateral leg pain and swelling EXAM: Bilateral LOWER EXTREMITY VENOUS DOPPLER ULTRASOUND TECHNIQUE: Gray-scale sonography with compression, as well as color and duplex ultrasound, were performed to evaluate the deep venous system(s) from the level of the common femoral vein through the popliteal and proximal calf veins. COMPARISON:  None Available. FINDINGS: VENOUS Normal compressibility of the common femoral, superficial femoral, and popliteal veins, as well as the visualized calf veins. Visualized portions of profunda femoral vein and great saphenous vein unremarkable. No filling defects to suggest DVT on grayscale or color Doppler imaging. Doppler waveforms show normal direction of venous flow, normal respiratory plasticity and response to augmentation. OTHER None. Limitations: none IMPRESSION: Negative. Electronically Signed   By: 02/28/2022 M.D.   On: 02/26/2022 19:31    PROCEDURES:  Critical Care performed: No  Procedures   MEDICATIONS ORDERED IN ED: Medications - No data to display   IMPRESSION / MDM / ASSESSMENT AND PLAN / ED COURSE  I reviewed the triage vital signs and the nursing notes.                              Differential diagnosis includes, but is not limited to, leg strain, DVT, Baker's cyst, arthritis   Patient's diagnosis is consistent with knee pain.  Patient presents emergency department bilateral knee pain.  He does have a history of DVT.  He has had 2 in his lifetime, both times he took anticoagulation for 3 months and then was taken off by vascular surgery.  Patient started with a new primary care, mentions the knee  pain and was referred to the ED for evaluation and ultrasound.  Imaging reveals no evidence of DVT, exam was otherwise reassuring.  Patient spends long hours on his feet every day.  Suspect musculoskeletal component of his knee pain.  Anti-inflammatory for symptom relief.  Concerning signs and symptoms are discussed with the patient.  Follow-up primary care as needed..  Patient is given ED precautions to return to the ED for any worsening or new symptoms.        FINAL CLINICAL IMPRESSION(S) / ED DIAGNOSES   Final diagnoses:  Pain in both knees, unspecified chronicity     Rx / DC Orders   ED Discharge Orders          Ordered    meloxicam (MOBIC) 15 MG tablet  Daily        02/26/22 2042             Note:  This document was prepared using Dragon voice recognition software and may include unintentional dictation errors.   Lanette Hampshire 02/26/22 2042    Merwyn Katos, MD 02/26/22 [phone]

## 2022-03-04 DIAGNOSIS — G43909 Migraine, unspecified, not intractable, without status migrainosus: Secondary | ICD-10-CM | POA: Insufficient documentation

## 2022-03-04 DIAGNOSIS — M79604 Pain in right leg: Secondary | ICD-10-CM | POA: Insufficient documentation

## 2022-03-04 DIAGNOSIS — Z86718 Personal history of other venous thrombosis and embolism: Secondary | ICD-10-CM | POA: Insufficient documentation

## 2022-03-06 ENCOUNTER — Other Ambulatory Visit: Payer: Self-pay

## 2022-03-06 DIAGNOSIS — Z0000 Encounter for general adult medical examination without abnormal findings: Secondary | ICD-10-CM

## 2022-03-06 DIAGNOSIS — Z130 Encounter for screening for diseases of the blood and blood-forming organs and certain disorders involving the immune mechanism: Secondary | ICD-10-CM

## 2022-03-07 ENCOUNTER — Ambulatory Visit: Payer: BC Managed Care – PPO | Admitting: Nurse Practitioner

## 2022-03-22 ENCOUNTER — Other Ambulatory Visit: Payer: Self-pay | Admitting: Nurse Practitioner

## 2022-03-22 DIAGNOSIS — F411 Generalized anxiety disorder: Secondary | ICD-10-CM

## 2022-03-28 ENCOUNTER — Encounter: Payer: Self-pay | Admitting: Nurse Practitioner

## 2022-03-28 ENCOUNTER — Ambulatory Visit (INDEPENDENT_AMBULATORY_CARE_PROVIDER_SITE_OTHER): Payer: BC Managed Care – PPO | Admitting: Nurse Practitioner

## 2022-03-28 VITALS — BP 100/62 | HR 54 | Temp 98.1°F | Ht 68.9 in | Wt 203.6 lb

## 2022-03-28 DIAGNOSIS — R5383 Other fatigue: Secondary | ICD-10-CM

## 2022-03-28 DIAGNOSIS — Z0000 Encounter for general adult medical examination without abnormal findings: Secondary | ICD-10-CM

## 2022-03-28 DIAGNOSIS — Z86718 Personal history of other venous thrombosis and embolism: Secondary | ICD-10-CM

## 2022-03-28 DIAGNOSIS — Z13228 Encounter for screening for other metabolic disorders: Secondary | ICD-10-CM

## 2022-03-28 DIAGNOSIS — Z1329 Encounter for screening for other suspected endocrine disorder: Secondary | ICD-10-CM

## 2022-03-28 DIAGNOSIS — Z1321 Encounter for screening for nutritional disorder: Secondary | ICD-10-CM

## 2022-03-28 DIAGNOSIS — Z130 Encounter for screening for diseases of the blood and blood-forming organs and certain disorders involving the immune mechanism: Secondary | ICD-10-CM

## 2022-03-28 MED ORDER — APIXABAN 2.5 MG PO TABS: 2.5000 mg | ORAL_TABLET | Freq: Two times a day (BID) | ORAL | 2 refills | Status: AC

## 2022-03-28 NOTE — Progress Notes (Signed)
Established patient visit   Patient: Ernest Mathews.   DOB: January 13, 1986   36 y.o. Male  MRN: [id number] Visit Date: 03/28/2022   No chief complaint on file.  Subjective    HPI  Follow up visit.  -tiredness and swelling of left leg. Compound fracture of left leg when he was 11. 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 -states that left leg is always swollen and discolored. This has been occurring since initial blood clots two years ago  -states that anxiety and depression are doing well.  -he has no new concerns or complaints.    Medications: Outpatient Medications Prior to Visit  Medication Sig   ALPRAZolam (XANAX) 0.5 MG tablet Take 1 tablet (0.5 mg total) by mouth daily as needed for anxiety.   cephALEXin (KEFLEX) 500 MG capsule cephalexin 500 mg capsule  TAKE 1 CAPSULE BY MOUTH THREE TIMES DAILY UNTIL COMPLETED COURSE FOR 5 DAYS   FLUoxetine (PROZAC) 40 MG capsule Take 1 capsule (40 mg total) by mouth daily.   omeprazole (PRILOSEC) 40 MG capsule Take 1 capsule (40 mg total) by mouth daily.   ondansetron (ZOFRAN) 8 MG tablet Zofran 8 mg tablet  Take 1 tablet every 8 hours by oral route as needed for nausea for 5 days.   SUMAtriptan (IMITREX) 100 MG tablet Take 1 tablet (100 mg total) by mouth every 2 (two) hours as needed for migraine. May repeat in 2 hours if headache persists or recurs.   [DISCONTINUED] meloxicam (MOBIC) 15 MG tablet Take 1 tablet (15 mg total) by mouth daily.   [DISCONTINUED] apixaban (ELIQUIS) 2.5 MG TABS tablet Take 1 tablet (2.5 mg total) by mouth 2 (two) times daily.   No facility-administered medications prior to visit.    Review of Systems  Constitutional:  Positive for fatigue. Negative for activity change, chills and fever.  HENT:  Negative for congestion, postnasal drip, rhinorrhea, sinus pressure, sinus pain, sneezing and sore throat.   Eyes: Negative.   Respiratory:  Negative for cough, shortness of breath and wheezing.   Cardiovascular:  Negative  for chest pain and palpitations.  Gastrointestinal:  Negative for constipation, diarrhea, nausea and vomiting.  Endocrine: Negative for cold intolerance, heat intolerance, polydipsia and polyuria.  Genitourinary:  Negative for dysuria, frequency and urgency.  Musculoskeletal:  Positive for arthralgias and joint swelling. Negative for back pain and myalgias.  Skin:  Negative for rash.  Allergic/Immunologic: Negative for environmental allergies.  Neurological:  Negative for dizziness, weakness and headaches.  Psychiatric/Behavioral:  The patient is nervous/anxious.      Objective     Today's Vitals   03/28/22 0902  BP: 100/62  Pulse: (!) 54  Temp: 98.1 F (36.7 C)  SpO2: 98%  Weight: 203 lb 9.6 oz (92.4 kg)  Height: 5' 8.9" (1.75 m)   Body mass index is 30.16 kg/m.   BP Readings from Last 3 Encounters:  03/28/22 100/62  02/26/22 116/84  02/21/22 112/65    Wt Readings from Last 3 Encounters:  03/28/22 203 lb 9.6 oz (92.4 kg)  02/26/22 200 lb (90.7 kg)  02/21/22 207 lb 12.8 oz (94.3 kg)    Physical Exam Vitals and nursing note reviewed.  Constitutional:      Appearance: Normal appearance. He is well-developed.  HENT:     Head: Normocephalic and atraumatic.     Nose: Nose normal.     Mouth/Throat:     Mouth: Mucous membranes are moist.     Pharynx: Oropharynx is clear.  Eyes:     Extraocular Movements: Extraocular movements intact.     Conjunctiva/sclera: Conjunctivae normal.     Pupils: Pupils are equal, round, and reactive to light.  Cardiovascular:     Rate and Rhythm: Normal rate and regular rhythm.     Pulses: Normal pulses.     Heart sounds: Normal heart sounds.  Pulmonary:     Effort: Pulmonary effort is normal.     Breath sounds: Normal breath sounds.  Abdominal:     Palpations: Abdomen is soft.  Musculoskeletal:        General: Normal range of motion.     Cervical back: Normal range of motion and neck supple.  Lymphadenopathy:     Cervical: No  cervical adenopathy.  Skin:    General: Skin is warm and dry.     Capillary Refill: Capillary refill takes less than 2 seconds.  Neurological:     General: No focal deficit present.     Mental Status: He is alert and oriented to person, place, and time.  Psychiatric:        Mood and Affect: Mood normal.        Behavior: Behavior normal.        Thought Content: Thought content normal.        Judgment: Judgment normal.      Assessment & Plan    1. History of DVT (deep vein thrombosis) Reviewed recent ER visit notes. Was negative for DVT at that time. Does have history of multiple DVTs in the past. Will keep him on eliquis 2.37m twice daily.  - apixaban (ELIQUIS) 2.5 MG TABS tablet; Take 1 tablet (2.5 mg total) by mouth 2 (two) times daily.  Dispense: 60 tablet; Refill: 2  2. Other fatigue Check CMP and TSH for further evaluation.  - TSH + free T4; Future - Comprehensive metabolic panel; Future - Comprehensive metabolic panel - TSH + free T4  3. Screening for endocrine, nutritional, metabolic and immunity disorder Routine, fasting labs drawn during today's visit.  - Lipid panel - Hemoglobin A1c - CBC with Differential/Platelet - Comp Met (CMET) - TSH  4. Healthcare maintenance Routine, fasting labs drawn during today's visit.  - TSH + free T4; Future - Lipid panel; Future - Comprehensive metabolic panel; Future - CBC; Future - CBC - Comprehensive metabolic panel - Lipid panel - TSH + free T4    Problem List Items Addressed This Visit       Other   History of DVT (deep vein thrombosis) - Primary   Relevant Medications   apixaban (ELIQUIS) 2.5 MG TABS tablet   Other fatigue   Relevant Orders   TSH + free T4   Comprehensive metabolic panel   Other Visit Diagnoses     Screening for endocrine, nutritional, metabolic and immunity disorder       Healthcare maintenance       Relevant Orders   TSH + free T4   Lipid panel   Comprehensive metabolic panel   CBC         Return in about 3 months (around 06/28/2022) for health maintenance exam.         HRonnell Freshwater NP  CBirnamwoodat FL[phone]) [phone](fax)  [place]

## 2022-03-29 LAB — LIPID PANEL
Chol/HDL Ratio: 3.5 ratio (ref 0.0–5.0)
Cholesterol, Total: 156 mg/dL (ref 100–199)
HDL: 44 mg/dL (ref >39)
LDL Chol Calc (NIH): 97 mg/dL (ref 0–99)
Triglycerides: 81 mg/dL (ref 0–149)
VLDL Cholesterol Cal: 15 mg/dL (ref 5–40)

## 2022-03-29 LAB — COMPREHENSIVE METABOLIC PANEL
ALT: 31 IU/L (ref 0–44)
AST: 22 IU/L (ref 0–40)
Albumin/Globulin Ratio: 1.7 (ref 1.2–2.2)
Albumin: 4.3 g/dL (ref 4.0–5.0)
Alkaline Phosphatase: 87 IU/L (ref 44–121)
BUN/Creatinine Ratio: 12 (ref 9–20)
BUN: 13 mg/dL (ref 6–20)
Bilirubin Total: 0.6 mg/dL (ref 0.0–1.2)
CO2: 23 mmol/L (ref 20–29)
Calcium: 9.7 mg/dL (ref 8.7–10.2)
Chloride: 104 mmol/L (ref 96–106)
Creatinine, Ser: 1.07 mg/dL (ref 0.76–1.27)
Globulin, Total: 2.6 g/dL (ref 1.5–4.5)
Glucose: 106 mg/dL — ABNORMAL HIGH (ref 70–99)
Potassium: 5 mmol/L (ref 3.5–5.2)
Sodium: 139 mmol/L (ref 134–144)
Total Protein: 6.9 g/dL (ref 6.0–8.5)
eGFR: 92 mL/min/{1.73_m2} (ref >59)

## 2022-03-29 LAB — CBC
Hematocrit: 44.4 % (ref 37.5–51.0)
Hemoglobin: 15.4 g/dL (ref 13.0–17.7)
MCH: 30.1 pg (ref 26.6–33.0)
MCHC: 34.7 g/dL (ref 31.5–35.7)
MCV: 87 fL (ref 79–97)
Platelets: 195 10*3/uL (ref 150–450)
RBC: 5.11 x10E6/uL (ref 4.14–5.80)
RDW: 11.3 % — ABNORMAL LOW (ref 11.6–15.4)
WBC: 7 10*3/uL (ref 3.4–10.8)

## 2022-03-29 LAB — TSH+FREE T4
Free T4: 1.32 ng/dL (ref 0.82–1.77)
TSH: 1.48 u[IU]/mL (ref 0.450–4.500)

## 2022-03-29 LAB — HEMOGLOBIN A1C
Est. average glucose Bld gHb Est-mCnc: 105 mg/dL
Hgb A1c MFr Bld: 5.3 % (ref 4.8–5.6)

## 2022-05-15 ENCOUNTER — Other Ambulatory Visit: Payer: Self-pay | Admitting: Nurse Practitioner

## 2022-05-15 DIAGNOSIS — F411 Generalized anxiety disorder: Secondary | ICD-10-CM

## 2022-05-16 NOTE — Progress Notes (Signed)
Please let the patient know that labs look good. We will review them in more detail at his next visit in 06/2022.

## 2022-05-16 NOTE — Telephone Encounter (Signed)
Reviewed PDMP profile and fill history is appropriate with no red flags or state indicators. Will get controlled substance policy signed at his next office visit with me.

## 2022-06-27 ENCOUNTER — Encounter: Payer: BC Managed Care – PPO | Admitting: Nurse Practitioner

## 2022-06-27 ENCOUNTER — Other Ambulatory Visit: Payer: Self-pay | Admitting: Nurse Practitioner

## 2022-06-27 DIAGNOSIS — K219 Gastro-esophageal reflux disease without esophagitis: Secondary | ICD-10-CM

## 2022-07-24 ENCOUNTER — Other Ambulatory Visit: Payer: Self-pay | Admitting: Nurse Practitioner

## 2022-07-24 DIAGNOSIS — F411 Generalized anxiety disorder: Secondary | ICD-10-CM

## 2022-07-26 ENCOUNTER — Emergency Department (HOSPITAL_BASED_OUTPATIENT_CLINIC_OR_DEPARTMENT_OTHER)
Admission: EM | Admit: 2022-07-26 | Discharge: 2022-07-27 | Disposition: A | Discharge status: Home / Self Care | Payer: BC Managed Care – PPO | Attending: Emergency Medicine | Admitting: Emergency Medicine

## 2022-07-26 ENCOUNTER — Other Ambulatory Visit: Payer: Self-pay

## 2022-07-26 ENCOUNTER — Encounter (HOSPITAL_BASED_OUTPATIENT_CLINIC_OR_DEPARTMENT_OTHER): Payer: Self-pay

## 2022-07-26 DIAGNOSIS — M5432 Sciatica, left side: Secondary | ICD-10-CM

## 2022-07-26 DIAGNOSIS — Z7901 Long term (current) use of anticoagulants: Secondary | ICD-10-CM | POA: Insufficient documentation

## 2022-07-26 DIAGNOSIS — M5442 Lumbago with sciatica, left side: Secondary | ICD-10-CM | POA: Insufficient documentation

## 2022-07-26 DIAGNOSIS — R269 Unspecified abnormalities of gait and mobility: Secondary | ICD-10-CM | POA: Insufficient documentation

## 2022-07-26 NOTE — ED Triage Notes (Signed)
Patient arrives from home c/o left sided lower back pain that radiates down left leg for the past 2 weeks. Pt has hx of sciatica and states he had back surgery 10 years ago. Pt states he took ibuprofen with no relief. Pt states pain is 10/10.

## 2022-07-27 MED ORDER — PREDNISONE 50 MG PO TABS
60.0000 mg | ORAL_TABLET | Freq: Once | ORAL | Status: AC
  Administered 2022-07-27: 60 mg via ORAL
  Filled 2022-07-27: qty 1

## 2022-07-27 MED ORDER — HYDROMORPHONE HCL 1 MG/ML IJ SOLN
1.0000 mg | Freq: Once | INTRAMUSCULAR | Status: AC
  Administered 2022-07-27: 1 mg via INTRAMUSCULAR
  Filled 2022-07-27: qty 1

## 2022-07-27 MED ORDER — PREDNISONE 10 MG (21) PO TBPK: ORAL_TABLET | ORAL | 0 refills | Status: DC

## 2022-07-27 MED ORDER — HYDROCODONE-ACETAMINOPHEN 5-325 MG PO TABS: 1.0000 | ORAL_TABLET | Freq: Four times a day (QID) | ORAL | 0 refills | Status: DC | PRN

## 2022-07-27 NOTE — ED Provider Notes (Signed)
[facility]  Provider Note  CSN: [id number] Arrival date & time: 07/26/22 1843  History Chief Complaint  Patient presents with   Back Pain    Ernest Mathews. is a 36 y.o. male with remote history of sciatica s/p lumbar surgery 8 years ago reports 2 weeks of L lower back pain, radiating into L leg. No fevers, no IVDA, no bowel or bladder problems. Pain is worse with certain movements. Not improed with OTC motrin. Has remote history of DVT, recent imaging has been negative but continues on Eliquis long term.    Home Medications Prior to Admission medications   Medication Sig Start Date End Date Taking? Authorizing Provider  HYDROcodone-acetaminophen (NORCO/VICODIN) 5-325 MG tablet Take 1 tablet by mouth every 6 (six) hours as needed for severe pain. 07/27/22  Yes Truddie Hidden, MD  predniSONE (STERAPRED UNI-PAK 21 TAB) 10 MG (21) TBPK tablet 10mg  Tabs, 6 day taper. Use as directed 07/27/22  Yes Truddie Hidden, MD  ALPRAZolam Duanne Moron) 0.5 MG tablet TAKE 1 TABLET BY MOUTH daily AS NEEDED FOR anxiety 05/16/22   Ronnell Freshwater, NP  apixaban (ELIQUIS) 2.5 MG TABS tablet Take 1 tablet (2.5 mg total) by mouth 2 (two) times daily. 03/28/22   Ronnell Freshwater, NP  cephALEXin (KEFLEX) 500 MG capsule cephalexin 500 mg capsule  TAKE 1 CAPSULE BY MOUTH THREE TIMES DAILY UNTIL COMPLETED COURSE FOR 5 DAYS    [provider]  FLUoxetine (PROZAC) 40 MG capsule Take 1 capsule (40 mg total) by mouth daily. 02/21/22   Ronnell Freshwater, NP  omeprazole (PRILOSEC) 40 MG capsule TAKE 1 CAPSULE BY MOUTH EVERY DAY 06/28/22   Ronnell Freshwater, NP  ondansetron (ZOFRAN) 8 MG tablet Zofran 8 mg tablet  Take 1 tablet every 8 hours by oral route as needed for nausea for 5 days.    [provider]  SUMAtriptan (IMITREX) 100 MG tablet Take 1 tablet (100 mg total) by mouth every 2 (two) hours as needed for migraine. May repeat in 2 hours if headache persists or  recurs. 02/21/22   Ronnell Freshwater, NP     Allergies    Cefaclor   Review of Systems   Review of Systems Please see HPI for pertinent positives and negatives  Physical Exam BP (!) 137/93 (BP Location: Right Arm)   Pulse (!) 50   Temp 97.9 F (36.6 C) (Oral)   Resp 20   Ht 5\' 10"  (1.778 m)   Wt 90.7 kg   SpO2 100%   BMI 28.70 kg/m   Physical Exam Vitals and nursing note reviewed.  Constitutional:      Appearance: Normal appearance.  HENT:     Head: Normocephalic and atraumatic.     Nose: Nose normal.     Mouth/Throat:     Mouth: Mucous membranes are moist.  Eyes:     Extraocular Movements: Extraocular movements intact.     Conjunctiva/sclera: Conjunctivae normal.  Cardiovascular:     Rate and Rhythm: Normal rate.  Pulmonary:     Effort: Pulmonary effort is normal.     Breath sounds: Normal breath sounds.  Abdominal:     General: Abdomen is flat.     Palpations: Abdomen is soft.     Tenderness: There is no abdominal tenderness.  Musculoskeletal:        General: Tenderness (L lumbar paraspinal muscles and sciatic notch) present. No swelling. Normal range of motion.     Cervical  back: Neck supple.  Skin:    General: Skin is warm and dry.  Neurological:     General: No focal deficit present.     Mental Status: He is alert.     Sensory: No sensory deficit.     Motor: No weakness.     Gait: Gait abnormal (antalgic).  Psychiatric:        Mood and Affect: Mood normal.     ED Results / Procedures / Treatments   EKG None  Procedures Procedures  Medications Ordered in the ED Medications  HYDROmorphone (DILAUDID) injection 1 mg (has no administration in time range)  predniSONE (DELTASONE) tablet 60 mg (has no administration in time range)    Initial Impression and Plan  Patient with L sided sciatica, no red flags. No recent narcotics, will avoid NSAIDs due to DOAC use. Plan IM pain meds here, begin a course of prednisone, short course of oral norco and  refer to Neurosurg for further evaluation.   ED Course       MDM Rules/Calculators/A&P Medical Decision Making Problems Addressed: Sciatica of left side: acute illness or injury  Risk Prescription drug management. Parenteral controlled substances.    Final Clinical Impression(s) / ED Diagnoses Final diagnoses:  Sciatica of left side    Rx / DC Orders ED Discharge Orders          Ordered    HYDROcodone-acetaminophen (NORCO/VICODIN) 5-325 MG tablet  Every 6 hours PRN        07/27/22 0022    predniSONE (STERAPRED UNI-PAK 21 TAB) 10 MG (21) TBPK tablet        07/27/22 0022             Pollyann Savoy, MD 07/27/22 [phone]

## 2023-10-08 ENCOUNTER — Ambulatory Visit (HOSPITAL_COMMUNITY)
Admission: EM | Admit: 2023-10-08 | Discharge: 2023-10-08 | Disposition: A | Discharge status: Home / Self Care | Payer: Self-pay | Attending: Emergency Medicine | Admitting: Emergency Medicine

## 2023-10-08 ENCOUNTER — Other Ambulatory Visit: Payer: Self-pay

## 2023-10-08 ENCOUNTER — Encounter (HOSPITAL_COMMUNITY): Payer: Self-pay | Admitting: Emergency Medicine

## 2023-10-08 DIAGNOSIS — B029 Zoster without complications: Secondary | ICD-10-CM

## 2023-10-08 MED ORDER — VALACYCLOVIR HCL 1 G PO TABS: 1000.0000 mg | ORAL_TABLET | Freq: Three times a day (TID) | ORAL | 0 refills | Status: AC

## 2023-10-08 NOTE — ED Provider Notes (Signed)
 [facility]    CSN: [id number] Arrival date & time: 10/08/23  1539      History   Chief Complaint Chief Complaint  Patient presents with   Rash    HPI Ernest Mathews. is a 38 y.o. male.   Patient presents to clinic for rash to the left side of his neck, scalp, left side of his cheek and around his left ear.  Has had a headache and ear pain.  No vision changes.  No facial weakness.  Mother brought him in because she is concerned that it may be shingles.  Rash has been present for the past 2 days.  Patient has not had any medications or tried any interventions for this.  He has never had shingles before.  He has not been sick recently.  Reports he has been under a lot of stress.  The history is provided by the patient and medical records.  Rash   Past Medical History:  Diagnosis Date   Allergic rhinitis, cause unspecified 03/10/2013   ANXIETY 10/14/2007   Qualifier: Diagnosis of  By: Norleen MD, Lynwood ORN    Blood clot in vein    COMMON MIGRAINE 10/14/2007   Qualifier: Diagnosis of  By: Norleen MD, Lynwood ORN    DEPRESSION 10/14/2007   Qualifier: Diagnosis of  By: Norleen MD, Lynwood ORN    GERD 10/14/2007   Qualifier: Diagnosis of  By: Norleen MD, Lynwood ORN    Lumbar disc disease 10/14/2013    Patient Active Problem List   Diagnosis Date Noted   Other fatigue 03/28/2022   Pain of right lower extremity 03/04/2022   History of DVT (deep vein thrombosis) 03/04/2022   Acute migraine 03/04/2022   OSA (obstructive sleep apnea) 06/29/2020   Insomnia 06/29/2020   DVT of popliteal vein (HCC) 10/28/2018   Preventative health care 10/14/2013   Lumbar disc disease 10/14/2013   Right ureteral stone 10/14/2013   Low back pain 08/16/2013   Allergic rhinitis 03/10/2013   Erectile dysfunction 07/21/2012   Generalized anxiety disorder 10/14/2007   Depression 10/14/2007   Migraine without aura 10/14/2007   GERD 10/14/2007    Past Surgical History:  Procedure Laterality Date   BACK SURGERY      foot surgury     HAND SURGERY     SHOULDER SURGERY Right        Home Medications    Prior to Admission medications   Medication Sig Start Date End Date Taking? Authorizing Provider  valACYclovir  (VALTREX ) 1000 MG tablet Take 1 tablet (1,000 mg total) by mouth 3 (three) times daily for 10 days. 10/08/23 10/18/23 Yes Morrigan Wickens  N, FNP  ALPRAZolam  (XANAX ) 0.5 MG tablet TAKE 1 TABLET BY MOUTH daily AS NEEDED FOR anxiety Patient not taking: Reported on 10/08/2023 05/16/22   Boscia, Heather E, NP  apixaban  (ELIQUIS ) 2.5 MG TABS tablet Take 1 tablet (2.5 mg total) by mouth 2 (two) times daily. Patient not taking: Reported on 10/08/2023 03/28/22   Boscia, Heather E, NP  cephALEXin (KEFLEX) 500 MG capsule cephalexin 500 mg capsule  TAKE 1 CAPSULE BY MOUTH THREE TIMES DAILY UNTIL COMPLETED COURSE FOR 5 DAYS Patient not taking: Reported on 10/08/2023    [provider]  FLUoxetine  (PROZAC ) 40 MG capsule Take 1 capsule (40 mg total) by mouth daily. Patient not taking: Reported on 10/08/2023 02/21/22   Boscia, Heather E, NP  HYDROcodone -acetaminophen  (NORCO/VICODIN) 5-325 MG tablet Take 1 tablet by mouth every 6 (six) hours as needed  for severe pain. Patient not taking: Reported on 10/08/2023 07/27/22   Roselyn Carlin NOVAK, MD  omeprazole  (PRILOSEC) 40 MG capsule TAKE 1 CAPSULE BY MOUTH EVERY DAY Patient not taking: Reported on 10/08/2023 06/28/22   Boscia, Heather E, NP  ondansetron  (ZOFRAN ) 8 MG tablet Zofran  8 mg tablet  Take 1 tablet every 8 hours by oral route as needed for nausea for 5 days. Patient not taking: Reported on 10/08/2023    [provider]  predniSONE  (STERAPRED UNI-PAK 21 TAB) 10 MG (21) TBPK tablet 10mg  Tabs, 6 day taper. Use as directed Patient not taking: Reported on 10/08/2023 07/27/22   Roselyn Carlin NOVAK, MD  SUMAtriptan  (IMITREX ) 100 MG tablet Take 1 tablet (100 mg total) by mouth every 2 (two) hours as needed for migraine. May repeat in 2 hours if headache persists  or recurs. Patient not taking: Reported on 10/08/2023 02/21/22   Hanford Powell BRAVO, NP    Family History Family History  Problem Relation Age of Onset   Hypertension Mother    Migraines Mother    Depression Father     Social History Social History   Tobacco Use   Smoking status: Former    Types: Cigarettes   Smokeless tobacco: Never   Tobacco comments:    VAPE PEN  Vaping Use   Vaping status: Every Day  Substance Use Topics   Alcohol use: Yes    Comment: 12 pack per week   Drug use: No     Allergies   Cefaclor   Review of Systems Review of Systems  Per HPI   Physical Exam Triage Vital Signs ED Triage Vitals  Encounter Vitals Group     BP 10/08/23 1640 129/84     Systolic BP Percentile --      Diastolic BP Percentile --      Pulse Rate 10/08/23 1640 80     Resp 10/08/23 1640 16     Temp 10/08/23 1640 98.1 F (36.7 C)     Temp Source 10/08/23 1640 Oral     SpO2 10/08/23 1640 99 %     Weight --      Height --      Head Circumference --      Peak Flow --      Pain Score 10/08/23 1636 8     Pain Loc --      Pain Education --      Exclude from Growth Chart --    No data found.  Updated Vital Signs BP 129/84 (BP Location: Left Arm)   Pulse 80   Temp 98.1 F (36.7 C) (Oral)   Resp 16   SpO2 99%   Visual Acuity Right Eye Distance:   Left Eye Distance:   Bilateral Distance:    Right Eye Near:   Left Eye Near:    Bilateral Near:     Physical Exam Vitals and nursing note reviewed.  Constitutional:      Appearance: Normal appearance.  HENT:     Head: Normocephalic and atraumatic.      Comments: Grouped vesicular lesions to left neck, left cheek and around his left ear. No visible lesions inside of auditory canal. Ear pain, no hearing loss.     Right Ear: External ear normal.     Left Ear: External ear normal.     Nose: Nose normal.     Mouth/Throat:     Mouth: Mucous membranes are moist.  Eyes:     Conjunctiva/sclera: Conjunctivae normal.  Cardiovascular:     Rate and Rhythm: Normal rate.  Pulmonary:     Effort: Pulmonary effort is normal. No respiratory distress.  Musculoskeletal:        General: Normal range of motion.  Skin:    General: Skin is warm and dry.     Findings: Rash present.  Neurological:     General: No focal deficit present.     Mental Status: He is alert and oriented to person, place, and time.     GCS: GCS eye subscore is 4. GCS verbal subscore is 5. GCS motor subscore is 6.     Cranial Nerves: Cranial nerves 2-12 are intact. No facial asymmetry.     Comments: No facial nerve palsy.  Psychiatric:        Mood and Affect: Mood normal.        Behavior: Behavior normal. Behavior is cooperative.      UC Treatments / Results  Labs (all labs ordered are listed, but only abnormal results are displayed) Labs Reviewed - No data to display  EKG   Radiology No results found.  Procedures Procedures (including critical care time)  Medications Ordered in UC Medications - No data to display  Initial Impression / Assessment and Plan / UC Course  I have reviewed the triage vital signs and the nursing notes.  Pertinent labs & imaging results that were available during my care of the patient were reviewed by me and considered in my medical decision making (see chart for details).  Vitals and triage reviewed, patient is hemodynamically stable. Vesicular grouped lesions along single dermatome, none in auditory canal visualized. No vision changes. CN 2-12 grossly intact, no facial nerve palsy.  Will start on valacyclovir  for shingles.  Discussed risk of transmission to high risk populations, reports his son is unvaccinated.  Plan of care, follow-up care return precautions given, no questions at this time.     Final Clinical Impressions(s) / UC Diagnoses   Final diagnoses:  Herpes zoster without complication     Discharge Instructions      Take the antiviral tablet 3 times daily for the next 10  days or until your lesions crusted over. You are contagious until the lesions have crusted over to those have not had the varicella vaccine (Chicken Pox).   Unfortunately, due to the area where rashes it is hard to cover.  Stay out of work until the lesions have crusted over.  Seek immediate care for any vision loss, eye pain or any new concerning symptoms.    ED Prescriptions     Medication Sig Dispense Auth. Provider   valACYclovir  (VALTREX ) 1000 MG tablet Take 1 tablet (1,000 mg total) by mouth 3 (three) times daily for 10 days. 30 tablet Dreama, Abbygale Lapid  N, FNP      PDMP not reviewed this encounter.   Dreama Randee SAILOR, FNP 10/08/23 1719

## 2023-10-08 NOTE — ED Triage Notes (Signed)
 Patient has a rash to left side of head and neck.  This rash was noticed 2 days ago.  Complains of headache.    Patient has not had any medicine for headache

## 2023-10-08 NOTE — Discharge Instructions (Addendum)
 Take the antiviral tablet 3 times daily for the next 10 days or until your lesions crusted over. You are contagious until the lesions have crusted over to those have not had the varicella vaccine (Chicken Pox).   Unfortunately, due to the area where rashes it is hard to cover.  Stay out of work until the lesions have crusted over.  Seek immediate care for any vision loss, eye pain or any new concerning symptoms.

## 2024-09-16 ENCOUNTER — Ambulatory Visit (INDEPENDENT_AMBULATORY_CARE_PROVIDER_SITE_OTHER)

## 2024-09-16 VITALS — BP 123/77 | HR 70 | Temp 98.3°F | Ht 70.0 in | Wt 203.0 lb

## 2024-09-16 DIAGNOSIS — J3089 Other allergic rhinitis: Secondary | ICD-10-CM

## 2024-09-16 DIAGNOSIS — Z23 Encounter for immunization: Secondary | ICD-10-CM

## 2024-09-16 DIAGNOSIS — F329 Major depressive disorder, single episode, unspecified: Secondary | ICD-10-CM

## 2024-09-16 DIAGNOSIS — Z0000 Encounter for general adult medical examination without abnormal findings: Secondary | ICD-10-CM

## 2024-09-16 DIAGNOSIS — G43909 Migraine, unspecified, not intractable, without status migrainosus: Secondary | ICD-10-CM

## 2024-09-16 DIAGNOSIS — Z1159 Encounter for screening for other viral diseases: Secondary | ICD-10-CM | POA: Diagnosis not present

## 2024-09-16 DIAGNOSIS — G43009 Migraine without aura, not intractable, without status migrainosus: Secondary | ICD-10-CM | POA: Diagnosis not present

## 2024-09-16 DIAGNOSIS — Z1321 Encounter for screening for nutritional disorder: Secondary | ICD-10-CM

## 2024-09-16 DIAGNOSIS — Z1329 Encounter for screening for other suspected endocrine disorder: Secondary | ICD-10-CM | POA: Diagnosis not present

## 2024-09-16 DIAGNOSIS — Z130 Encounter for screening for diseases of the blood and blood-forming organs and certain disorders involving the immune mechanism: Secondary | ICD-10-CM | POA: Diagnosis not present

## 2024-09-16 DIAGNOSIS — Z13228 Encounter for screening for other metabolic disorders: Secondary | ICD-10-CM

## 2024-09-16 DIAGNOSIS — R590 Localized enlarged lymph nodes: Secondary | ICD-10-CM | POA: Insufficient documentation

## 2024-09-16 DIAGNOSIS — F411 Generalized anxiety disorder: Secondary | ICD-10-CM

## 2024-09-16 DIAGNOSIS — Z86718 Personal history of other venous thrombosis and embolism: Secondary | ICD-10-CM | POA: Diagnosis not present

## 2024-09-16 DIAGNOSIS — K219 Gastro-esophageal reflux disease without esophagitis: Secondary | ICD-10-CM

## 2024-09-16 MED ORDER — OMEPRAZOLE 40 MG PO CPDR: 40.0000 mg | DELAYED_RELEASE_CAPSULE | Freq: Every day | ORAL | 2 refills | Status: AC

## 2024-09-16 MED ORDER — SUMATRIPTAN SUCCINATE 100 MG PO TABS: 100.0000 mg | ORAL_TABLET | ORAL | 5 refills | Status: AC | PRN

## 2024-09-16 MED ORDER — ALPRAZOLAM 0.5 MG PO TABS: 0.5000 mg | ORAL_TABLET | Freq: Every day | ORAL | 0 refills | Status: DC | PRN

## 2024-09-16 MED ORDER — FLUOXETINE HCL 40 MG PO CAPS: 40.0000 mg | ORAL_CAPSULE | Freq: Every day | ORAL | 3 refills | Status: AC

## 2024-09-16 NOTE — Assessment & Plan Note (Signed)
 Depression managed with fluoxetine , ran out two days ago. Anxiety managed with alprazolam , as needed. Discussed importance of not using alprazolam  daily. - Refilled fluoxetine  prescription. - Refilled alprazolam  prescription for as-needed use. PDMP reviewed. No aberrancies.

## 2024-09-16 NOTE — Progress Notes (Signed)
 New Patient Office Visit  Subjective    Patient ID: Ernest Mathews., male    DOB: 06/12/1986  Age: 38 y.o. MRN: [id number]  CC:  Chief Complaint  Patient presents with   Annual Exam    History of Present Illness   Ernest Mathews. is a 38 year old male who presents for re-establishing care/CPE. Patient is overall doing well and sleeping well. Eating a well balanced diet. Physically active through his job at [employer]. He has 2 children - 18 84 year old boy and 47 85 year old girl.   Subcutaneous back masses - Two tender lumps on the left side of posterior neck; one present for approximately 15 years, recently more painful and protruding, described as throbbing; a new lump appeared last week adjacent to previous one, also tender and protruding.  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 - Previously treated with Eliquis  2.5 mg twice daily, discontinued at the end of 2023 due to loss of insurance coverage. - No current symptoms of DVT: no leg swelling, pain, shortness of breath, or chest pain. - Has never seen a specialist and DVTs are as of current considered unprovoked.   Migraine headaches - Migraines occur approximately four times per month, typically once weekly. - Currently experiencing a migraine during the visit. - Sumatriptan  (Imitrex ) previously effective for relief. - Occasional use of ibuprofen, which is not very effective. - No use of daily preventive migraine medication.  Depression and anxiety - Takes fluoxetine  40 mg daily for depression and anxiety; ran out of medication two days ago. - Prescribed alprazolam  (Xanax ), used sparingly (about twice a week), not taken recently.  Gastroesophageal reflux symptoms - History of acid reflux. - Previously taking omeprazole , currently out of medication. - Sx were previous well controlled on this medication.  Screenings:  Colon Cancer: NA Lung Cancer:  NA Breast Cancer: NA Diabetes: Checking A1c with labs HLD: Checking lipid panel with labs The 10-year ASCVD risk score (Arnett DK, et al., 2019) is: 0.8%   Outpatient Encounter Medications as of 09/16/2024  Medication Sig   ALPRAZolam  (XANAX ) 0.5 MG tablet Take 1 tablet (0.5 mg total) by mouth daily as needed. for anxiety   apixaban  (ELIQUIS ) 2.5 MG TABS tablet Take 1 tablet (2.5 mg total) by mouth 2 (two) times daily. (Patient not taking: Reported on 10/08/2023)   FLUoxetine  (PROZAC ) 40 MG capsule Take 1 capsule (40 mg total) by mouth daily.   omeprazole  (PRILOSEC) 40 MG capsule Take 1 capsule (40 mg total) by mouth daily.   SUMAtriptan  (IMITREX ) 100 MG tablet Take 1 tablet (100 mg total) by mouth every 2 (two) hours as needed for migraine. May repeat in 2 hours if headache persists or recurs.   [DISCONTINUED] ALPRAZolam  (XANAX ) 0.5 MG tablet TAKE 1 TABLET BY MOUTH daily AS NEEDED FOR anxiety (Patient not taking: Reported on 10/08/2023)   [DISCONTINUED] cephALEXin (KEFLEX) 500 MG capsule cephalexin 500 mg capsule  TAKE 1 CAPSULE BY MOUTH THREE TIMES DAILY UNTIL COMPLETED COURSE FOR 5 DAYS (Patient not taking: Reported on 10/08/2023)   [DISCONTINUED] FLUoxetine  (PROZAC ) 40 MG capsule Take 1 capsule (40 mg total) by mouth daily. (Patient not taking: Reported on 10/08/2023)   [DISCONTINUED] HYDROcodone -acetaminophen  (NORCO/VICODIN) 5-325 MG tablet Take 1 tablet by mouth every 6 (six) hours as needed for severe pain. (Patient not taking: Reported on 10/08/2023)   [DISCONTINUED] omeprazole  (PRILOSEC) 40 MG capsule TAKE 1 CAPSULE BY  MOUTH EVERY DAY (Patient not taking: Reported on 10/08/2023)   [DISCONTINUED] ondansetron  (ZOFRAN ) 8 MG tablet Zofran  8 mg tablet  Take 1 tablet every 8 hours by oral route as needed for nausea for 5 days. (Patient not taking: Reported on 10/08/2023)   [DISCONTINUED] predniSONE  (STERAPRED UNI-PAK 21 TAB) 10 MG (21) TBPK tablet 10mg  Tabs, 6 day taper. Use as directed (Patient not  taking: Reported on 10/08/2023)   [DISCONTINUED] SUMAtriptan  (IMITREX ) 100 MG tablet Take 1 tablet (100 mg total) by mouth every 2 (two) hours as needed for migraine. May repeat in 2 hours if headache persists or recurs. (Patient not taking: Reported on 10/08/2023)   No facility-administered encounter medications on file as of 09/16/2024.    Past Medical History:  Diagnosis Date   Allergic rhinitis, cause unspecified 03/10/2013   ANXIETY 10/14/2007   Qualifier: Diagnosis of  By: Norleen MD, Lynwood ORN    Blood clot in vein    COMMON MIGRAINE 10/14/2007   Qualifier: Diagnosis of  By: Norleen MD, Lynwood ORN    DEPRESSION 10/14/2007   Qualifier: Diagnosis of  By: Norleen MD, Lynwood ORN    DVT of popliteal vein (HCC) 10/28/2018   GERD 10/14/2007   Qualifier: Diagnosis of  By: Norleen MD, Lynwood ORN    Lumbar disc disease 10/14/2013   Right ureteral stone 10/14/2013    Past Surgical History:  Procedure Laterality Date   BACK SURGERY     foot surgury     HAND SURGERY     SHOULDER SURGERY Right     Family History  Problem Relation Age of Onset   Hypertension Mother    Migraines Mother    Depression Father     Social History   Socioeconomic History   Marital status: Married    Spouse name: Not on file   Number of children: Not on file   Years of education: Not on file   Highest education level: Not on file  Occupational History   Not on file  Tobacco Use   Smoking status: Former    Types: Cigarettes   Smokeless tobacco: Never   Tobacco comments:    VAPE PEN  Vaping Use   Vaping status: Every Day  Substance and Sexual Activity   Alcohol use: Yes    Comment: 12 pack per week   Drug use: No   Sexual activity: Yes  Other Topics Concern   Not on file  Social History Narrative   Not on file   Social Drivers of Health   Tobacco Use: Medium Risk (10/08/2023)   Patient History    Smoking Tobacco Use: Former    Smokeless Tobacco Use: Never    Passive Exposure: Not on Actuary  Strain: Not on file  Food Insecurity: No Food Insecurity (09/16/2024)   Epic    Worried About Programme Researcher, Broadcasting/film/video in the Last Year: Never true    Ran Out of Food in the Last Year: Never true  Transportation Needs: No Transportation Needs (09/16/2024)   Epic    Lack of Transportation (Medical): No    Lack of Transportation (Non-Medical): No  Physical Activity: Not on file  Stress: Not on file  Social Connections: Not on file  Intimate Partner Violence: Not At Risk (09/16/2024)   Epic    Fear of Current or Ex-Partner: No    Emotionally Abused: No    Physically Abused: No    Sexually Abused: No  Depression (PHQ2-9): Medium Risk (09/16/2024)  Depression (PHQ2-9)    PHQ-2 Score: 8  Alcohol Screen: Not on file  Housing: High Risk (09/16/2024)   Epic    Unable to Pay for Housing in the Last Year: Yes    Number of Times Moved in the Last Year: 0    Homeless in the Last Year: No  Utilities: Not At Risk (09/16/2024)   Epic    Threatened with loss of utilities: No  Health Literacy: Not on file    ROS  Per HPI      Objective    BP 123/77   Pulse 70   Temp 98.3 F (36.8 C) (Oral)   Ht 5' 10 (1.778 m)   Wt 203 lb (92.1 kg)   SpO2 97%   BMI 29.13 kg/m   Physical Exam Constitutional:      General: He is not in acute distress.    Appearance: Normal appearance.  Eyes:     Pupils: Pupils are equal, round, and reactive to light.  Neck:     Comments: There are 2 protruding nodes in the left posterior cervical region. One approximately quarter sized, mobile, but hard. Another adjacent node that is mobile and soft. There is no overlying redness or discharge. Both nodes are TTP.  Cardiovascular:     Rate and Rhythm: Normal rate and regular rhythm.     Heart sounds: Normal heart sounds. No murmur heard.    No friction rub. No gallop.  Pulmonary:     Effort: Pulmonary effort is normal. No respiratory distress.     Breath sounds: Normal breath sounds.  Abdominal:      General: Bowel sounds are normal.     Palpations: Abdomen is soft.  Musculoskeletal:        General: No swelling.     Cervical back: Neck supple.  Skin:    General: Skin is warm and dry.  Neurological:     General: No focal deficit present.     Mental Status: He is alert.  Psychiatric:        Mood and Affect: Mood normal.        Behavior: Behavior normal.        Thought Content: Thought content normal.          Assessment & Plan:   Encounter for vaccination -     Flu vaccine trivalent PF, 6mos and older(Flulaval,Afluria,Fluarix,Fluzone)  Acute migraine -     SUMAtriptan  Succinate; Take 1 tablet (100 mg total) by mouth every 2 (two) hours as needed for migraine. May repeat in 2 hours if headache persists or recurs.  Dispense: 10 tablet; Refill: 5  Gastroesophageal reflux disease without esophagitis Assessment & Plan: Stable on PPI  Orders: -     Omeprazole ; Take 1 capsule (40 mg total) by mouth daily.  Dispense: 90 capsule; Refill: 2  Generalized anxiety disorder -     FLUoxetine  HCl; Take 1 capsule (40 mg total) by mouth daily.  Dispense: 90 capsule; Refill: 3 -     ALPRAZolam ; Take 1 tablet (0.5 mg total) by mouth daily as needed. for anxiety  Dispense: 15 tablet; Refill: 0  Lymphadenopathy of left cervical region -     US  SOFT TISSUE HEAD & NECK (NON-THYROID ); Future  Screening for endocrine, nutritional, metabolic and immunity disorder -     VITAMIN D 25 Hydroxy (Vit-D Deficiency, Fractures); Future -     CBC with Differential/Platelet; Future -     Hemoglobin A1c; Future -     Comprehensive  metabolic panel with GFR; Future -     Lipid panel; Future -     TSH; Future  Screening for viral disease -     HIV Antibody (routine testing w rflx); Future -     Hepatitis C antibody; Future  History of DVT (deep vein thrombosis) Assessment & Plan: Recurrent DVTs, 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 Previously on Eliquis  2.5 mg BID, discontinued due to loss of insurance in  2024. No current symptoms. Specialist evaluation needed due to young age and lack of risk factors. - Ordered blood work to establish baseline before restarting Eliquis . - Will refer to hematologist for evaluation.  Orders: -     Protime-INR; Future -     APTT; Future  Migraine without aura and without status migrainosus, not intractable Assessment & Plan: Chronic migraines, four times a month. Previously managed with sumatriptan , effective. Discussed Excedrin for relief. - Refilled sumatriptan  prescription. - Recommended Excedrin for over-the-counter relief of more minor headaches.   Current episode of major depressive disorder without prior episode, unspecified depression episode severity Assessment & Plan: Depression managed with fluoxetine , ran out two days ago. Anxiety managed with alprazolam , as needed. Discussed importance of not using alprazolam  daily. - Refilled fluoxetine  prescription. - Refilled alprazolam  prescription for as-needed use. PDMP reviewed. No aberrancies.    Allergic rhinitis due to other allergic trigger, unspecified seasonality Assessment & Plan: Seasonal allergies causing discomfort. Discussed over-the-counter management options. - Recommended over-the-counter antihistamines such as Xyzal or Claritin. - Recommended Flonase  nasal spray for symptom relief.    Posterior cervical lymphadenopathy Assessment & Plan: Tender neck mass present for 15 years, reports recent growth and development of new, adjacent lesion. Differential includes reactive lymph node versus lipoma. Given tenderness, reported growth, and hardness of the area to palpation, ultrasound recommended. - Ordered ultrasound of neck mass.   Preventative health care Assessment & Plan: Routine wellness visit with focus on health maintenance. - Administered flu shot. - Scheduled fasting blood work for comprehensive screening. - Plan for colonoscopy at age 89 unless family history  changes.     Return in about 6 months (around 03/17/2025) for Med check.   Saddie JULIANNA Sacks, PA-C

## 2024-09-16 NOTE — Assessment & Plan Note (Signed)
 Routine wellness visit with focus on health maintenance. - Administered flu shot. - Scheduled fasting blood work for comprehensive screening. - Plan for colonoscopy at age 38 unless family history changes.

## 2024-09-16 NOTE — Patient Instructions (Signed)
 VISIT SUMMARY: During your visit, we addressed your medication refills and evaluated the tender lumps on your back. We also discussed your history of deep vein thrombosis, migraines, depression and anxiety, gastroesophageal reflux, and seasonal allergies. A flu shot was administered, and we scheduled blood work and an ultrasound for further evaluation.  YOUR PLAN: SUBCUTANEOUS NECK MASSES: You have two tender lumps on your neck, one of which has been present for 15 years and recently became more painful, and a new lump that appeared last week. -We will perform an ultrasound to evaluate the lumps further.  HISTORY OF RECURRENT DEEP VEIN THROMBOSIS: You have a history of multiple DVTs, with the last episode occurring two years ago. You were previously on Eliquis  but had to stop due to insurance issues. -We ordered blood work to establish a baseline before restarting Eliquis . -You will be referred to a hematologist for further evaluation.  MIGRAINE: You experience migraines about four times a month, and sumatriptan  has been effective for relief. -We refilled your sumatriptan  prescription. -We recommended using Excedrin for over-the-counter relief.  GASTROESOPHAGEAL REFLUX DISEASE: You have a history of acid reflux and were previously taking omeprazole . -We refilled your omeprazole  prescription.  DEPRESSION AND GENERALIZED ANXIETY DISORDER: You take fluoxetine  for depression and anxiety and ran out of medication two days ago. You also use alprazolam  as needed for anxiety. -We refilled your fluoxetine  prescription. -We refilled your alprazolam  prescription for as-needed use.  LOCALIZED SWELLING, MASS AND LUMP, NECK: You have a tender neck mass that has been present for 15 years and recently became tender. -We ordered an ultrasound to evaluate the neck mass.  ALLERGIC RHINITIS: You have seasonal allergies that cause discomfort. -We recommended over-the-counter antihistamines such as Xyzal or  Claritin. -We recommended Flonase  nasal spray for symptom relief.  ADULT WELLNESS VISIT: Routine wellness visit with focus on health maintenance. -You received a flu shot. -We scheduled fasting blood work for comprehensive screening. -We plan for a colonoscopy at age 38 unless family history changes.  If you have any problems before your next visit feel free to message me via MyChart (minor issues or questions) or call the office, otherwise you may reach out to schedule an office visit.  Thank you! Saddie Sacks, PA-C

## 2024-09-16 NOTE — Assessment & Plan Note (Signed)
 Seasonal allergies causing discomfort. Discussed over-the-counter management options. - Recommended over-the-counter antihistamines such as Xyzal or Claritin. - Recommended Flonase  nasal spray for symptom relief.

## 2024-09-16 NOTE — Assessment & Plan Note (Signed)
 Recurrent DVTs, 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 Previously on Eliquis  2.5 mg BID, discontinued due to loss of insurance in 2024. No current symptoms. Specialist evaluation needed due to young age and lack of risk factors. - Ordered blood work to establish baseline before restarting Eliquis . - Will refer to hematologist for evaluation.

## 2024-09-16 NOTE — Assessment & Plan Note (Signed)
 Stable on PPI

## 2024-09-16 NOTE — Assessment & Plan Note (Signed)
 Chronic migraines, four times a month. Previously managed with sumatriptan , effective. Discussed Excedrin for relief. - Refilled sumatriptan  prescription. - Recommended Excedrin for over-the-counter relief of more minor headaches.

## 2024-09-16 NOTE — Assessment & Plan Note (Signed)
 Tender neck mass present for 15 years, reports recent growth and development of new, adjacent lesion. Differential includes reactive lymph node versus lipoma. Given tenderness, reported growth, and hardness of the area to palpation, ultrasound recommended. - Ordered ultrasound of neck mass.

## 2024-09-21 ENCOUNTER — Other Ambulatory Visit

## 2024-09-24 ENCOUNTER — Inpatient Hospital Stay: Admission: RE | Admit: 2024-09-24

## 2024-10-05 ENCOUNTER — Other Ambulatory Visit: Payer: Self-pay

## 2024-10-05 DIAGNOSIS — F411 Generalized anxiety disorder: Secondary | ICD-10-CM

## 2024-10-20 ENCOUNTER — Other Ambulatory Visit

## 2024-10-21 LAB — CBC WITH DIFFERENTIAL/PLATELET
Basophils Absolute: 0 x10E3/uL (ref 0.0–0.2)
Basos: 1 %
EOS (ABSOLUTE): 0.1 x10E3/uL (ref 0.0–0.4)
Eos: 3 %
Hematocrit: 43.1 % (ref 37.5–51.0)
Hemoglobin: 14.2 g/dL (ref 13.0–17.7)
Immature Grans (Abs): 0 x10E3/uL (ref 0.0–0.1)
Immature Granulocytes: 0 %
Lymphocytes Absolute: 0.7 x10E3/uL (ref 0.7–3.1)
Lymphs: 15 %
MCH: 30.8 pg (ref 26.6–33.0)
MCHC: 32.9 g/dL (ref 31.5–35.7)
MCV: 94 fL (ref 79–97)
Monocytes Absolute: 0.5 x10E3/uL (ref 0.1–0.9)
Monocytes: 11 %
Neutrophils Absolute: 3.1 x10E3/uL (ref 1.4–7.0)
Neutrophils: 69 %
Platelets: 195 x10E3/uL (ref 150–450)
RBC: 4.61 x10E6/uL (ref 4.14–5.80)
RDW: 12.4 % (ref 11.6–15.4)
WBC: 4.5 x10E3/uL (ref 3.4–10.8)

## 2024-10-21 LAB — LIPID PANEL
Chol/HDL Ratio: 2.5 ratio (ref 0.0–5.0)
Cholesterol, Total: 163 mg/dL (ref 100–199)
HDL: 64 mg/dL
LDL Chol Calc (NIH): 87 mg/dL (ref 0–99)
Triglycerides: 59 mg/dL (ref 0–149)
VLDL Cholesterol Cal: 12 mg/dL (ref 5–40)

## 2024-10-21 LAB — COMPREHENSIVE METABOLIC PANEL WITH GFR
ALT: 32 IU/L (ref 0–44)
AST: 28 IU/L (ref 0–40)
Albumin: 4.2 g/dL (ref 4.1–5.1)
Alkaline Phosphatase: 73 IU/L (ref 47–123)
BUN/Creatinine Ratio: 15 (ref 9–20)
BUN: 15 mg/dL (ref 6–24)
Bilirubin Total: 0.4 mg/dL (ref 0.0–1.2)
CO2: 23 mmol/L (ref 20–29)
Calcium: 9.1 mg/dL (ref 8.7–10.2)
Chloride: 106 mmol/L (ref 96–106)
Creatinine, Ser: 1.02 mg/dL (ref 0.76–1.27)
Globulin, Total: 2.3 g/dL (ref 1.5–4.5)
Glucose: 105 mg/dL — ABNORMAL HIGH (ref 70–99)
Potassium: 4.8 mmol/L (ref 3.5–5.2)
Sodium: 141 mmol/L (ref 134–144)
Total Protein: 6.5 g/dL (ref 6.0–8.5)
eGFR: 95 mL/min/1.73

## 2024-10-21 LAB — PROTIME-INR
INR: 0.9 (ref 0.9–1.2)
Prothrombin Time: 9.9 s (ref 9.1–12.0)

## 2024-10-21 LAB — HEMOGLOBIN A1C
Est. average glucose Bld gHb Est-mCnc: 100 mg/dL
Hgb A1c MFr Bld: 5.1 % (ref 4.8–5.6)

## 2024-10-21 LAB — VITAMIN D 25 HYDROXY (VIT D DEFICIENCY, FRACTURES): Vit D, 25-Hydroxy: 15.3 ng/mL — ABNORMAL LOW (ref 30.0–100.0)

## 2024-10-21 LAB — HEPATITIS C ANTIBODY: Hep C Virus Ab: NONREACTIVE

## 2024-10-21 LAB — HIV ANTIBODY (ROUTINE TESTING W REFLEX): HIV Screen 4th Generation wRfx: NONREACTIVE

## 2024-10-21 LAB — APTT: aPTT: 25 s (ref 24–33)

## 2024-10-21 LAB — TSH: TSH: 2 u[IU]/mL (ref 0.450–4.500)

## 2024-10-25 ENCOUNTER — Ambulatory Visit: Payer: Self-pay

## 2024-10-25 DIAGNOSIS — Z86718 Personal history of other venous thrombosis and embolism: Secondary | ICD-10-CM

## 2024-11-29 ENCOUNTER — Inpatient Hospital Stay

## 2024-11-29 ENCOUNTER — Inpatient Hospital Stay: Admitting: Oncology

## 2025-03-17 ENCOUNTER — Ambulatory Visit
